# Patient Record
Sex: Female | Born: 1960 | Race: Black or African American | Hispanic: No | Marital: Single | Smoking: Current every day smoker
Health system: Southern US, Community
[De-identification: ages and names within clinical notes are randomized; demographics above are authoritative.]

## PROBLEM LIST (undated history)

## (undated) DIAGNOSIS — C801 Malignant (primary) neoplasm, unspecified: Secondary | ICD-10-CM

## (undated) DIAGNOSIS — B009 Herpesviral infection, unspecified: Secondary | ICD-10-CM

## (undated) DIAGNOSIS — B1920 Unspecified viral hepatitis C without hepatic coma: Secondary | ICD-10-CM

## (undated) DIAGNOSIS — I10 Essential (primary) hypertension: Secondary | ICD-10-CM

## (undated) DIAGNOSIS — N920 Excessive and frequent menstruation with regular cycle: Secondary | ICD-10-CM

## (undated) HISTORY — DX: Essential (primary) hypertension: I10

## (undated) HISTORY — PX: BREAST SURGERY: SHX581

## (undated) HISTORY — DX: Excessive and frequent menstruation with regular cycle: N92.0

## (undated) HISTORY — PX: NO PAST SURGERIES: SHX2092

---

## 2012-10-11 ENCOUNTER — Encounter (INDEPENDENT_AMBULATORY_CARE_PROVIDER_SITE_OTHER): Payer: Self-pay | Admitting: Surgery

## 2012-10-11 ENCOUNTER — Ambulatory Visit (INDEPENDENT_AMBULATORY_CARE_PROVIDER_SITE_OTHER): Payer: Medicaid Other | Admitting: Surgery

## 2012-10-11 VITALS — BP 142/88 | HR 60 | Temp 98.3°F | Resp 18 | Ht 59.0 in | Wt 101.2 lb

## 2012-10-11 DIAGNOSIS — D249 Benign neoplasm of unspecified breast: Secondary | ICD-10-CM

## 2012-10-11 NOTE — Progress Notes (Signed)
Patient ID: Megan Kelly. Megan Kelly, female   DOB: 11-Feb-1961, 51 y.o.   MRN: [id number]  Chief Complaint  Patient presents with  . New Evaluation    rt Br.    HPI [place]. [name] is a 51 y.o. female.   HPI She is referred by Dr. Tilda Burrow for evaluation of a right breast papilloma. She was found to have an abnormal mammogram on recent screening. A small lesion was identified the right breast. Ultrasound-guided biopsy revealed a papilloma. She has no previous history of problems with her breasts. She denies nipple discharge. Her family history is negative for breast cancer.  History reviewed. No pertinent past medical history.  History reviewed. No pertinent past surgical history.  Family History  Problem Relation Age of Onset  . Diabetes Mother   . Hypertension Mother   . Heart disease Father     Social History History  Substance Use Topics  . Smoking status: Current Every Day Smoker -- 0.5 packs/day  . Smokeless tobacco: Not on file   Comment: smoked for 15 yrs  . Alcohol Use: Not on file    No Known Allergies  No current outpatient prescriptions on file.    Review of Systems Review of Systems  Constitutional: Negative for fever, chills and unexpected weight change.  HENT: Negative for hearing loss, congestion, sore throat, trouble swallowing and voice change.   Eyes: Negative for visual disturbance.  Respiratory: Negative for cough and wheezing.   Cardiovascular: Negative for chest pain, palpitations and leg swelling.  Gastrointestinal: Negative for nausea, vomiting, abdominal pain, diarrhea, constipation, blood in stool, abdominal distention and anal bleeding.  Genitourinary: Negative for hematuria, vaginal bleeding and difficulty urinating.  Musculoskeletal: Negative for arthralgias.  Skin: Negative for rash and wound.  Neurological: Negative for seizures, syncope and headaches.  Hematological: Negative for adenopathy. Does not bruise/bleed easily.  Psychiatric/Behavioral:  Negative for confusion.    Blood pressure 142/88, pulse 60, temperature 98.3 F (36.8 C), resp. rate 18, height 4\' 11"  (1.499 m), weight 101 lb 3.2 oz (45.904 kg).  Physical Exam Physical Exam  Constitutional: She is oriented to person, place, and time. She appears well-developed and well-nourished. No distress.  HENT:  Head: Normocephalic and atraumatic.  Right Ear: External ear normal.  Left Ear: External ear normal.  Nose: Nose normal.  Mouth/Throat: Oropharynx is clear and moist. No oropharyngeal exudate.  Eyes: Conjunctivae normal are normal. Pupils are equal, round, and reactive to light. Right eye exhibits no discharge. Left eye exhibits no discharge. No scleral icterus.  Neck: Normal range of motion. Neck supple. No tracheal deviation present. No thyromegaly present.  Cardiovascular: Normal rate, regular rhythm, normal heart sounds and intact distal pulses.   No murmur heard. Pulmonary/Chest: Effort normal and breath sounds normal.  Abdominal: Soft. Bowel sounds are normal. She exhibits no distension. There is no tenderness. There is no rebound.  Musculoskeletal: Normal range of motion. She exhibits no edema and no tenderness.  Lymphadenopathy:    She has no cervical adenopathy.    She has no axillary adenopathy.  Neurological: She is alert and oriented to person, place, and time.  Skin: Skin is warm and dry. No rash noted. She is not diaphoretic. No erythema.  Psychiatric: Her behavior is normal. Judgment normal.  Breasts: There are no palpable masses in either breast. Areola and nipples are normal bilaterally  Data Reviewed I have reviewed her mammograms and biopsy report. 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 I discussed this with her in detail. I discussed the risks of surgery which includes but is not limited to bleeding, infection,  need for further surgery should malignancy be present, et Karie Soda. She understands and wishes to proceed. Surgery will be scheduled. Likelihood of success with good       Ahmari Garton A 10/11/2012, 4:14 PM

## 2012-10-12 ENCOUNTER — Encounter (INDEPENDENT_AMBULATORY_CARE_PROVIDER_SITE_OTHER): Payer: Self-pay

## 2012-10-25 ENCOUNTER — Encounter (HOSPITAL_COMMUNITY): Payer: Self-pay

## 2012-10-31 ENCOUNTER — Encounter (HOSPITAL_COMMUNITY): Payer: Self-pay

## 2012-10-31 ENCOUNTER — Encounter (HOSPITAL_COMMUNITY)
Admission: RE | Admit: 2012-10-31 | Discharge: 2012-10-31 | Disposition: A | Discharge status: Home / Self Care | Payer: Medicaid Other | Source: Ambulatory Visit | Attending: Surgery | Admitting: Surgery

## 2012-10-31 HISTORY — DX: Malignant (primary) neoplasm, unspecified: C80.1

## 2012-10-31 LAB — BASIC METABOLIC PANEL
BUN: 6 mg/dL (ref 6–23)
CO2: 26 mEq/L (ref 19–32)
Calcium: 9.4 mg/dL (ref 8.4–10.5)
Chloride: 105 mEq/L (ref 96–112)
Creatinine, Ser: 0.67 mg/dL (ref 0.50–1.10)

## 2012-10-31 LAB — CBC
HCT: 33.3 % — ABNORMAL LOW (ref 36.0–46.0)
MCH: 26.2 pg (ref 26.0–34.0)
MCV: 80.6 fL (ref 78.0–100.0)
RBC: 4.13 MIL/uL (ref 3.87–5.11)
WBC: 13 10*3/uL — ABNORMAL HIGH (ref 4.0–10.5)

## 2012-10-31 NOTE — Pre-Procedure Instructions (Signed)
[address] Alaynah M. [place]  10/31/2012   Your procedure is scheduled on:  11/08/12  Report to [facility] at 1100 AM.  Call this number if you have problems the morning of surgery: [phone]   Remember:   Do not eat food:After Midnight.     Take these medicines the morning of surgery with A SIP OF WATER: none   Do not wear jewelry, make-up or nail polish.  Do not wear lotions, powders, or perfumes. You may wear deodorant.  Do not shave 48 hours prior to surgery. Men may shave face and neck.  Do not bring valuables to the hospital.  Contacts, dentures or bridgework may not be worn into surgery.  Leave suitcase in the car. After surgery it may be brought to your room.  For patients admitted to the hospital, checkout time is 11:00 AM the day of discharge.   Patients discharged the day of surgery will not be allowed to drive home.  Name and phone number of your driver: family  Special Instructions: Shower using CHG 2 nights before surgery and the night before surgery.  If you shower the day of surgery use CHG.  Use special wash - you have one bottle of CHG for all showers.  You should use approximately 1/3 of the bottle for each shower.   Please read over the following fact sheets that you were given: Pain Booklet, Coughing and Deep Breathing, MRSA Information and Surgical Site Infection Prevention

## 2012-11-07 MED ORDER — CEFAZOLIN SODIUM-DEXTROSE 2-3 GM-% IV SOLR
2.0000 g | INTRAVENOUS | Status: AC
  Administered 2012-11-08: 2 g via INTRAVENOUS
  Filled 2012-11-07: qty 50

## 2012-11-07 NOTE — H&P (Signed)
Patient ID: Megan Kelly. Megan Kelly, female DOB: Apr 12, 1961, 51 y.o. MRN: [id number]  Chief Complaint   Patient presents with   .  New Evaluation     rt Br.    HPI  [place]. Megan Kelly is a 51 y.o. female.  HPI  She is referred by Dr. Tilda Burrow for evaluation of a right breast papilloma. She was found to have an abnormal mammogram on recent screening. A small lesion was identified the right breast. Ultrasound-guided biopsy revealed a papilloma. She has no previous history of problems with her breasts. She denies nipple discharge. Her family history is negative for breast cancer.  History reviewed. No pertinent past medical history.  History reviewed. No pertinent past surgical history.  Family History   Problem  Relation  Age of Onset   .  Diabetes  Mother    .  Hypertension  Mother    .  Heart disease  Father     Social History  History   Substance Use Topics   .  Smoking status:  Current Every Day Smoker -- 0.5 packs/day   .  Smokeless tobacco:  Not on file     Comment: smoked for 15 yrs    .  Alcohol Use:  Not on file    No Known Allergies  No current outpatient prescriptions on file.    Review of Systems  Review of Systems  Constitutional: Negative for fever, chills and unexpected weight change.  HENT: Negative for hearing loss, congestion, sore throat, trouble swallowing and voice change.  Eyes: Negative for visual disturbance.  Respiratory: Negative for cough and wheezing.  Cardiovascular: Negative for chest pain, palpitations and leg swelling.  Gastrointestinal: Negative for nausea, vomiting, abdominal pain, diarrhea, constipation, blood in stool, abdominal distention and anal bleeding.  Genitourinary: Negative for hematuria, vaginal bleeding and difficulty urinating.  Musculoskeletal: Negative for arthralgias.  Skin: Negative for rash and wound.  Neurological: Negative for seizures, syncope and headaches.  Hematological: Negative for adenopathy. Does not bruise/bleed easily.    Psychiatric/Behavioral: Negative for confusion.   Blood pressure 142/88, pulse 60, temperature 98.3 F (36.8 C), resp. rate 18, height 4\' 11"  (1.499 m), weight 101 lb 3.2 oz (45.904 kg).  Physical Exam  Physical Exam  Constitutional: She is oriented to person, place, and time. She appears well-developed and well-nourished. No distress.  HENT:  Head: Normocephalic and atraumatic.  Right Ear: External ear normal.  Left Ear: External ear normal.  Nose: Nose normal.  Mouth/Throat: Oropharynx is clear and moist. No oropharyngeal exudate.  Eyes: Conjunctivae normal are normal. Pupils are equal, round, and reactive to light. Right eye exhibits no discharge. Left eye exhibits no discharge. No scleral icterus.  Neck: Normal range of motion. Neck supple. No tracheal deviation present. No thyromegaly present.  Cardiovascular: Normal rate, regular rhythm, normal heart sounds and intact distal pulses.  No murmur heard.  Pulmonary/Chest: Effort normal and breath sounds normal.  Abdominal: Soft. Bowel sounds are normal. She exhibits no distension. There is no tenderness. There is no rebound.  Musculoskeletal: Normal range of motion. She exhibits no edema and no tenderness.  Lymphadenopathy:  She has no cervical adenopathy.  She has no axillary adenopathy.  Neurological: She is alert and oriented to person, place, and time.  Skin: Skin is warm and dry. No rash noted. She is not diaphoretic. No erythema.  Psychiatric: Her behavior is normal. Judgment normal.  Breasts: There are no palpable masses in either breast. Areola and nipples are normal bilaterally  Data Reviewed  I have reviewed her mammograms and biopsy report. 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 I discussed this with her in detail. I discussed the risks of surgery which includes but is not limited to  bleeding, infection, need for further surgery should malignancy be present, et Karie Soda. She understands and wishes to proceed. Surgery will be scheduled. Likelihood of success with good

## 2012-11-08 ENCOUNTER — Encounter (HOSPITAL_COMMUNITY): Payer: Self-pay | Admitting: Certified Registered"

## 2012-11-08 ENCOUNTER — Ambulatory Visit (HOSPITAL_COMMUNITY)
Admission: RE | Admit: 2012-11-08 | Discharge: 2012-11-08 | Disposition: A | Discharge status: Home / Self Care | Payer: Medicaid Other | Source: Ambulatory Visit | Attending: Surgery | Admitting: Surgery

## 2012-11-08 ENCOUNTER — Encounter (HOSPITAL_COMMUNITY): Payer: Self-pay | Admitting: *Deleted

## 2012-11-08 ENCOUNTER — Encounter (HOSPITAL_COMMUNITY)
Admission: RE | Disposition: A | Discharge status: Home / Self Care | Payer: Self-pay | Source: Ambulatory Visit | Attending: Surgery

## 2012-11-08 ENCOUNTER — Ambulatory Visit (HOSPITAL_COMMUNITY): Payer: Medicaid Other | Admitting: Certified Registered"

## 2012-11-08 DIAGNOSIS — F17200 Nicotine dependence, unspecified, uncomplicated: Secondary | ICD-10-CM | POA: Insufficient documentation

## 2012-11-08 DIAGNOSIS — Z01812 Encounter for preprocedural laboratory examination: Secondary | ICD-10-CM | POA: Insufficient documentation

## 2012-11-08 DIAGNOSIS — D249 Benign neoplasm of unspecified breast: Secondary | ICD-10-CM | POA: Insufficient documentation

## 2012-11-08 DIAGNOSIS — B1920 Unspecified viral hepatitis C without hepatic coma: Secondary | ICD-10-CM | POA: Insufficient documentation

## 2012-11-08 DIAGNOSIS — N6019 Diffuse cystic mastopathy of unspecified breast: Secondary | ICD-10-CM

## 2012-11-08 DIAGNOSIS — N6089 Other benign mammary dysplasias of unspecified breast: Secondary | ICD-10-CM

## 2012-11-08 HISTORY — PX: BREAST LUMPECTOMY WITH NEEDLE LOCALIZATION: SHX5759

## 2012-11-08 SURGERY — BREAST LUMPECTOMY WITH NEEDLE LOCALIZATION
Anesthesia: General | Site: Breast | Laterality: Right | Wound class: Clean

## 2012-11-08 MED ORDER — OXYCODONE HCL 5 MG PO TABS
ORAL_TABLET | ORAL | Status: AC
Start: 2012-11-08 — End: 2012-11-08
  Administered 2012-11-08: 5 mg via ORAL
  Filled 2012-11-08: qty 1

## 2012-11-08 MED ORDER — LACTATED RINGERS IV SOLN
INTRAVENOUS | Status: DC | PRN
  Administered 2012-11-08: 13:00:00 via INTRAVENOUS

## 2012-11-08 MED ORDER — BUPIVACAINE HCL (PF) 0.25 % IJ SOLN
INTRAMUSCULAR | Status: AC
  Filled 2012-11-08: qty 30

## 2012-11-08 MED ORDER — HYDROCODONE-ACETAMINOPHEN 5-325 MG PO TABS: 1.0000 | ORAL_TABLET | ORAL | Status: DC | PRN

## 2012-11-08 MED ORDER — PROPOFOL 10 MG/ML IV BOLUS
INTRAVENOUS | Status: DC | PRN
  Administered 2012-11-08: 160 mg via INTRAVENOUS

## 2012-11-08 MED ORDER — BUPIVACAINE-EPINEPHRINE PF 0.25-1:200000 % IJ SOLN
INTRAMUSCULAR | Status: AC
  Filled 2012-11-08: qty 30

## 2012-11-08 MED ORDER — LIDOCAINE HCL (CARDIAC) 20 MG/ML IV SOLN
INTRAVENOUS | Status: DC | PRN
  Administered 2012-11-08: 80 mg via INTRAVENOUS

## 2012-11-08 MED ORDER — MIDAZOLAM HCL 5 MG/5ML IJ SOLN
INTRAMUSCULAR | Status: DC | PRN
  Administered 2012-11-08: 2 mg via INTRAVENOUS

## 2012-11-08 MED ORDER — 0.9 % SODIUM CHLORIDE (POUR BTL) OPTIME
TOPICAL | Status: DC | PRN
  Administered 2012-11-08: 1000 mL

## 2012-11-08 MED ORDER — OXYCODONE HCL 5 MG PO TABS
5.0000 mg | ORAL_TABLET | ORAL | Status: DC | PRN
  Administered 2012-11-08: 5 mg via ORAL

## 2012-11-08 MED ORDER — BUPIVACAINE-EPINEPHRINE 0.25% -1:200000 IJ SOLN
INTRAMUSCULAR | Status: DC | PRN
  Administered 2012-11-08: 20 mL

## 2012-11-08 MED ORDER — FENTANYL CITRATE 0.05 MG/ML IJ SOLN
INTRAMUSCULAR | Status: DC | PRN
  Administered 2012-11-08: 75 ug via INTRAVENOUS
  Administered 2012-11-08: 50 ug via INTRAVENOUS

## 2012-11-08 SURGICAL SUPPLY — 40 items
BENZOIN TINCTURE PRP APPL 2/3 (GAUZE/BANDAGES/DRESSINGS) ×2 IMPLANT
BLADE SURG 10 STRL SS (BLADE) ×2 IMPLANT
BLADE SURG 15 STRL LF DISP TIS (BLADE) ×1 IMPLANT
BLADE SURG 15 STRL SS (BLADE) ×1
CANISTER SUCTION 2500CC (MISCELLANEOUS) IMPLANT
CHLORAPREP W/TINT 26ML (MISCELLANEOUS) ×2 IMPLANT
CLOTH BEACON ORANGE TIMEOUT ST (SAFETY) ×2 IMPLANT
COVER SURGICAL LIGHT HANDLE (MISCELLANEOUS) ×2 IMPLANT
DEVICE DUBIN SPECIMEN MAMMOGRA (MISCELLANEOUS) ×2 IMPLANT
DRAPE CHEST BREAST 15X10 FENES (DRAPES) ×2 IMPLANT
DRSG TEGADERM 4X4.75 (GAUZE/BANDAGES/DRESSINGS) ×2 IMPLANT
ELECT CAUTERY BLADE 6.4 (BLADE) ×2 IMPLANT
ELECT REM PT RETURN 9FT ADLT (ELECTROSURGICAL) ×2
ELECTRODE REM PT RTRN 9FT ADLT (ELECTROSURGICAL) ×1 IMPLANT
GLOVE BIO SURGEON STRL SZ7.5 (GLOVE) ×4 IMPLANT
GLOVE BIOGEL PI IND STRL 7.5 (GLOVE) ×1 IMPLANT
GLOVE BIOGEL PI INDICATOR 7.5 (GLOVE) ×1
GLOVE SURG SIGNA 7.5 PF LTX (GLOVE) ×2 IMPLANT
GOWN PREVENTION PLUS XLARGE (GOWN DISPOSABLE) ×2 IMPLANT
GOWN STRL NON-REIN LRG LVL3 (GOWN DISPOSABLE) ×4 IMPLANT
KIT BASIN OR (CUSTOM PROCEDURE TRAY) ×2 IMPLANT
KIT MARKER MARGIN INK (KITS) IMPLANT
KIT ROOM TURNOVER OR (KITS) ×2 IMPLANT
NS IRRIG 1000ML POUR BTL (IV SOLUTION) ×2 IMPLANT
PACK SURGICAL SETUP 50X90 (CUSTOM PROCEDURE TRAY) ×2 IMPLANT
PAD ARMBOARD 7.5X6 YLW CONV (MISCELLANEOUS) ×2 IMPLANT
PENCIL BUTTON HOLSTER BLD 10FT (ELECTRODE) ×2 IMPLANT
SPONGE GAUZE 4X4 12PLY (GAUZE/BANDAGES/DRESSINGS) ×2 IMPLANT
SPONGE LAP 4X18 X RAY DECT (DISPOSABLE) ×2 IMPLANT
STRIP CLOSURE SKIN 1/2X4 (GAUZE/BANDAGES/DRESSINGS) ×2 IMPLANT
SUT MON AB 4-0 PC3 18 (SUTURE) ×2 IMPLANT
SUT SILK 2 0 SH (SUTURE) IMPLANT
SUT VIC AB 3-0 SH 27 (SUTURE) ×1
SUT VIC AB 3-0 SH 27XBRD (SUTURE) ×1 IMPLANT
SYR BULB 3OZ (MISCELLANEOUS) ×2 IMPLANT
SYR CONTROL 10ML LL (SYRINGE) ×2 IMPLANT
TOWEL OR 17X24 6PK STRL BLUE (TOWEL DISPOSABLE) ×2 IMPLANT
TOWEL OR 17X26 10 PK STRL BLUE (TOWEL DISPOSABLE) ×2 IMPLANT
TUBE CONNECTING 12X1/4 (SUCTIONS) IMPLANT
YANKAUER SUCT BULB TIP NO VENT (SUCTIONS) IMPLANT

## 2012-11-08 NOTE — Anesthesia Preprocedure Evaluation (Addendum)
Anesthesia Evaluation  Patient identified by MRN, date of birth, ID band Patient awake    Reviewed: Allergy & Precautions, H&P , NPO status , Patient's Chart, lab work & pertinent test results  Airway Mallampati: I TM Distance: >3 FB     Dental  (+) Dental Advidsory Given and Chipped   Pulmonary Current Smoker,          Cardiovascular Rhythm:regular Rate:Normal     Neuro/Psych    GI/Hepatic (+) Hepatitis -, C and Unspecified  Endo/Other    Renal/GU      Musculoskeletal   Abdominal   Peds  Hematology   Anesthesia Other Findings Upper Left tooth chipped/rotted  Reproductive/Obstetrics                        Anesthesia Physical Anesthesia Plan  ASA: II  Anesthesia Plan: General   Post-op Pain Management:    Induction: Intravenous  Airway Management Planned: LMA  Additional Equipment:   Intra-op Plan:   Post-operative Plan: Extubation in OR  Informed Consent: I have reviewed the patients History and Physical, chart, labs and discussed the procedure including the risks, benefits and alternatives for the proposed anesthesia with the patient or authorized representative who has indicated his/her understanding and acceptance.     Plan Discussed with: CRNA, Anesthesiologist and Surgeon  Anesthesia Plan Comments:         Anesthesia Quick Evaluation

## 2012-11-08 NOTE — Preoperative (Signed)
Beta Blockers   Reason not to administer Beta Blockers:Not Applicable 

## 2012-11-08 NOTE — Anesthesia Postprocedure Evaluation (Signed)
  Anesthesia Post-op Note  Patient: Megan Kelly. Ed[place]  Procedure(s) Performed: Procedure(s) (LRB) with comments: BREAST LUMPECTOMY WITH NEEDLE LOCALIZATION (Right)  Patient Location: PACU  Anesthesia Type:General  Level of Consciousness: awake, oriented and patient cooperative  Airway and Oxygen Therapy: Patient Spontanous Breathing  Post-op Pain: mild  Post-op Assessment: Post-op Vital signs reviewed, Patient's Cardiovascular Status Stable, Respiratory Function Stable, Patent Airway, No signs of Nausea or vomiting and Pain level controlled  Post-op Vital Signs: Reviewed and stable  Complications: No apparent anesthesia complications

## 2012-11-08 NOTE — Op Note (Signed)
BREAST LUMPECTOMY WITH NEEDLE LOCALIZATION  Procedure Note  Delailah M. Caponigro 11/08/2012   Pre-op Diagnosis: RIGHT BREAST PAPILLOMA     Post-op Diagnosis: same  Procedure(s): BREAST LUMPECTOMY WITH NEEDLE LOCALIZATION  Surgeon(s): Shelly Rubenstein, MD  Anesthesia: General  Staff:  Pauletta Browns, RN - Circulator Janeece Agee Pingue, CST - Scrub Person Isidoro Donning, RN - Relief Circulator  Estimated Blood Loss: Minimal               Specimens: sent to path          [facility] A   Date: 11/08/2012  Time: 2:06 PM

## 2012-11-08 NOTE — Anesthesia Procedure Notes (Signed)
Procedure Name: LMA Insertion Date/Time: 11/08/2012 1:41 PM Performed by: Rossie Muskrat L Pre-anesthesia Checklist: Patient identified, Timeout performed, Emergency Drugs available, Suction available and Patient being monitored Patient Re-evaluated:Patient Re-evaluated prior to inductionOxygen Delivery Method: Circle system utilized Preoxygenation: Pre-oxygenation with 100% oxygen Intubation Type: IV induction Ventilation: Mask ventilation without difficulty LMA: LMA inserted LMA Size: 4.0 Tube type: Oral Number of attempts: 1 Placement Confirmation: breath sounds checked- equal and bilateral and positive ETCO2 Tube secured with: Tape Dental Injury: Teeth and Oropharynx as per pre-operative assessment

## 2012-11-08 NOTE — Progress Notes (Signed)
Films from breast ctr with patient.

## 2012-11-08 NOTE — Interval H&P Note (Signed)
History and Physical Interval Note:  No change in H and P  11/08/2012 11:40 AM  Megan Kelly  has presented today for surgery, with the diagnosis of right breast papalloma  The various methods of treatment have been discussed with the patient and family. After consideration of risks, benefits and other options for treatment, the patient has consented to  Procedure(s) (LRB) with comments: BREAST LUMPECTOMY WITH NEEDLE LOCALIZATION (Right) as a surgical intervention .  The patient's history has been reviewed, patient examined, no change in status, stable for surgery.  I have reviewed the patient's chart and labs.  Questions were answered to the patient's satisfaction.     Joleen Stuckert A

## 2012-11-08 NOTE — Transfer of Care (Signed)
Immediate Anesthesia Transfer of Care Note  Patient: Megan Kelly. [name]  Procedure(s) Performed: Procedure(s) (LRB) with comments: BREAST LUMPECTOMY WITH NEEDLE LOCALIZATION (Right)  Patient Location: PACU  Anesthesia Type:General  Level of Consciousness: awake, alert  and oriented  Airway & Oxygen Therapy: Patient Spontanous Breathing and Patient connected to nasal cannula oxygen  Post-op Assessment: Report given to PACU RN, Post -op Vital signs reviewed and stable and Patient moving all extremities  Post vital signs: Reviewed and stable  Complications: No apparent anesthesia complications

## 2012-11-09 ENCOUNTER — Encounter (HOSPITAL_COMMUNITY): Payer: Self-pay | Admitting: Surgery

## 2012-11-09 NOTE — Op Note (Signed)
NAMEMarland Kitchen  Megan Kelly, Megan Kelly NO.:  [id number]  MEDICAL RECORD NO.:  [id number]  LOCATION:  [facility]                         FACILITY:  [facility]  PHYSICIAN:  Abigail Miyamoto, M.D. DATE OF BIRTH:  1961-05-23  DATE OF PROCEDURE:  11/08/2012 DATE OF DISCHARGE:  11/08/2012                              OPERATIVE REPORT   PREOPERATIVE DIAGNOSIS:  Right breast papilloma.  POSTOPERATIVE DIAGNOSIS:  Right breast papilloma.  PROCEDURE:  Needle localized right breast lumpectomy.  SURGEONS:  Abigail Miyamoto, MD  ANESTHESIA:  General and 0.5% Marcaine.  ESTIMATED BLOOD LOSS:  Minimal.  INDICATIONS:  This is a 51 year old female with papilloma of the right breast.  Decision was made to proceed with lumpectomy under needle localization.  PROCEDURE IN DETAIL:  The patient was brought to the operating room and identified as Megan Kelly.  She was placed supine on the operating room table, and general anesthesia was induced.  Her right breast was then prepped and draped in usual sterile fashion.  The skin around the localization wire was anesthetized with Marcaine.  I made a longitudinal incision on the lower inner quadrant of the breast, incorporating the wire with a scalpel.  I took this down to the breast tissue with electrocautery.  A wide lumpectomy was then performed under needle localization, removing all the tissue around localization wire going down to the chest wall.  Again, wide margins appeared to be achieved. The specimen was removed in its entirety and x-ray confirmed the suspicious area, was in the specimen.  It was then sent to Pathology for evaluation.  I anesthetized the wound further with Marcaine.  Hemostasis was achieved with cautery.  I closed subcutaneous tissue with interrupted 3-0 Vicryl sutures and closed the skin with a running 4-0 Monocryl.  Steri-Strips, gauze, and Tegaderm were then applied.  The patient tolerated the procedure well.  All of the counts  were correct at the end of procedure.  The patient was then extubated in the operating room and taken in a stable condition to the recovery room.     Abigail Miyamoto, M.D.     DB/MEDQ  D:  11/08/2012  T:  11/09/2012  Job:  [id number]

## 2012-11-29 ENCOUNTER — Ambulatory Visit (INDEPENDENT_AMBULATORY_CARE_PROVIDER_SITE_OTHER): Payer: Medicaid Other | Admitting: Surgery

## 2012-11-29 ENCOUNTER — Encounter (INDEPENDENT_AMBULATORY_CARE_PROVIDER_SITE_OTHER): Payer: Self-pay | Admitting: Surgery

## 2012-11-29 VITALS — BP 124/77 | HR 70 | Temp 97.4°F | Resp 14 | Ht <= 58 in | Wt 102.0 lb

## 2012-11-29 DIAGNOSIS — Z09 Encounter for follow-up examination after completed treatment for conditions other than malignant neoplasm: Secondary | ICD-10-CM

## 2012-11-29 NOTE — Progress Notes (Signed)
Subjective:     Patient ID: Megan Kelly. Megan Kelly, female   DOB: 06/09/1961, 51 y.o.   MRN: [id number]  HPI She is here for her first postop visit status post right breast lumpectomy. She is doing well and has no complaints.  Review of Systems     Objective:   Physical Exam The incision is healing well. There is no evidence of infection. The final pathology shows benign disease with no evidence of malignancy    Assessment:     Patient stable postop    Plan:     Continue self examinations and yearly mammograms. I will see her back as needed

## 2012-12-09 ENCOUNTER — Emergency Department (HOSPITAL_BASED_OUTPATIENT_CLINIC_OR_DEPARTMENT_OTHER)
Admission: EM | Admit: 2012-12-09 | Discharge: 2012-12-09 | Disposition: A | Discharge status: Home / Self Care | Payer: Medicaid Other | Attending: Emergency Medicine | Admitting: Emergency Medicine

## 2012-12-09 ENCOUNTER — Encounter (HOSPITAL_BASED_OUTPATIENT_CLINIC_OR_DEPARTMENT_OTHER): Payer: Self-pay | Admitting: Emergency Medicine

## 2012-12-09 ENCOUNTER — Emergency Department (HOSPITAL_BASED_OUTPATIENT_CLINIC_OR_DEPARTMENT_OTHER): Payer: Medicaid Other

## 2012-12-09 DIAGNOSIS — Z23 Encounter for immunization: Secondary | ICD-10-CM | POA: Insufficient documentation

## 2012-12-09 DIAGNOSIS — S6980XA Other specified injuries of unspecified wrist, hand and finger(s), initial encounter: Secondary | ICD-10-CM | POA: Insufficient documentation

## 2012-12-09 DIAGNOSIS — Y939 Activity, unspecified: Secondary | ICD-10-CM | POA: Insufficient documentation

## 2012-12-09 DIAGNOSIS — F17200 Nicotine dependence, unspecified, uncomplicated: Secondary | ICD-10-CM | POA: Insufficient documentation

## 2012-12-09 DIAGNOSIS — Y929 Unspecified place or not applicable: Secondary | ICD-10-CM | POA: Insufficient documentation

## 2012-12-09 DIAGNOSIS — X58XXXA Exposure to other specified factors, initial encounter: Secondary | ICD-10-CM | POA: Insufficient documentation

## 2012-12-09 DIAGNOSIS — IMO0002 Reserved for concepts with insufficient information to code with codable children: Secondary | ICD-10-CM

## 2012-12-09 DIAGNOSIS — Z8619 Personal history of other infectious and parasitic diseases: Secondary | ICD-10-CM | POA: Insufficient documentation

## 2012-12-09 DIAGNOSIS — Z859 Personal history of malignant neoplasm, unspecified: Secondary | ICD-10-CM | POA: Insufficient documentation

## 2012-12-09 DIAGNOSIS — S6990XA Unspecified injury of unspecified wrist, hand and finger(s), initial encounter: Secondary | ICD-10-CM | POA: Insufficient documentation

## 2012-12-09 HISTORY — DX: Herpesviral infection, unspecified: B00.9

## 2012-12-09 MED ORDER — TETANUS-DIPHTH-ACELL PERTUSSIS 5-2.5-18.5 LF-MCG/0.5 IM SUSP
0.5000 mL | Freq: Once | INTRAMUSCULAR | Status: AC
  Administered 2012-12-09: 0.5 mL via INTRAMUSCULAR
  Filled 2012-12-09: qty 0.5

## 2012-12-09 MED ORDER — TRAMADOL HCL 50 MG PO TABS: 50.0000 mg | ORAL_TABLET | Freq: Four times a day (QID) | ORAL | Status: DC | PRN

## 2012-12-09 NOTE — ED Notes (Signed)
Pt with large callous looking formation to left middle finger, pt feels like their is something inside of the finger

## 2012-12-09 NOTE — ED Provider Notes (Signed)
History     CSN: [id number]  Arrival date & time 12/09/12  0129   First MD Initiated Contact with Patient 12/09/12 0207      Chief Complaint  Patient presents with  . Finger Injury    (Consider location/radiation/quality/duration/timing/severity/associated sxs/prior treatment) Patient is a 51 y.o. female presenting with hand pain. The history is provided by the patient.  Hand Pain This is a chronic problem. The current episode started more than 1 week ago (noted a callous on the distal palmar aspect of the left middle finger and a feeling that something was in the finger on November 08, 2012 or before.  Does not recall trauma). The problem occurs constantly. The problem has not changed since onset.Pertinent negatives include no chest pain, no abdominal pain, no headaches and no shortness of breath. Nothing aggravates the symptoms. Nothing relieves the symptoms. She has tried nothing for the symptoms. The treatment provided no relief.    Past Medical History  Diagnosis Date  . Cancer   . Herpes simplex type 2 infection     Past Surgical History  Procedure Date  . No past surgeries   . Breast lumpectomy with needle localization 11/08/2012    Procedure: BREAST LUMPECTOMY WITH NEEDLE LOCALIZATION;  Surgeon: Shelly Rubenstein, MD;  Location: [facility] OR;  Service: General;  Laterality: Right;  . Breast surgery     Family History  Problem Relation Age of Onset  . Diabetes Mother   . Hypertension Mother   . Heart disease Father     History  Substance Use Topics  . Smoking status: Current Every Day Smoker -- 0.5 packs/day for 15 years    Types: Cigarettes  . Smokeless tobacco: Not on file     Comment: smoked for 15 yrs  . Alcohol Use: No     Comment: quit 2 yrs ago    OB History    Grav Para Term Preterm Abortions TAB SAB Ect Mult Living                  Review of Systems  Respiratory: Negative for shortness of breath.   Cardiovascular: Negative for chest pain.   Gastrointestinal: Negative for abdominal pain.  Neurological: Negative for headaches.  All other systems reviewed and are negative.    Allergies  Review of patient's allergies indicates no known allergies.  Home Medications   Current Outpatient Rx  Name  Route  Sig  Dispense  Refill  . HYDROCODONE-ACETAMINOPHEN 5-325 MG PO TABS   Oral   Take 1-2 tablets by mouth every 4 (four) hours as needed for pain.   30 tablet   1     BP 130/88  Pulse 72  Temp 98.9 F (37.2 C) (Oral)  Resp 16  Ht 4\' 11"  (1.499 m)  Wt 106 lb (48.081 kg)  BMI 21.41 kg/m2  SpO2 100%  LMP 11/30/2012  Physical Exam  Constitutional: She is oriented to person, place, and time. She appears well-developed and well-nourished. No distress.  HENT:  Head: Normocephalic and atraumatic.  Mouth/Throat: Oropharynx is clear and moist.  Eyes: Conjunctivae normal are normal. Pupils are equal, round, and reactive to light.  Neck: Normal range of motion. Neck supple.  Cardiovascular: Normal rate, regular rhythm and intact distal pulses.   Pulmonary/Chest: Effort normal and breath sounds normal.  Abdominal: Soft. Bowel sounds are normal.  Musculoskeletal: Normal range of motion. She exhibits no tenderness.       Large callous on the palmar aspect of the  distal left miffle finger.  No opening no erythema no swelling no fluctuance. Cap refill < 2 sec to all fingers of the left hand.  Left hand neurovascularly intact  Neurological: She is alert and oriented to person, place, and time.  Skin: Skin is warm and dry. No rash noted. No erythema.  Psychiatric: She has a normal mood and affect.    ED Course  Procedures (including critical care time)  Labs Reviewed - No data to display Dg Hand Complete Left  12/09/2012  *RADIOLOGY REPORT*  Clinical Data: Question foreign body.  LEFT HAND - COMPLETE 3+ VIEW  Comparison: None.  Findings: There is a curvilinear radiopaque density projecting along the radial volar soft tissues  of the distal phalanx third digit.  No acute fracture or dislocation.  Associated soft tissue swelling.  No aggressive osseous lesion.  IMPRESSION: 5 mm thin curvilinear radiopaque density within the radial volar soft tissues of the distal third digit.   Original Report Authenticated By: Jearld Lesch, M.D.      No diagnosis found.    MDM  Based on XRAY deep foreign body.  No signs of infection.  Suspect the foreign body has been there longer than 1 month. Due to chronicity will not extract in the ED.  Will provide pain meds and patient to follow up with hand surgery.  Patient verbalizes understanding and agrees to follow up       Shawntez Dickison Smitty Cords, MD 12/09/12 [phone]

## 2013-03-12 ENCOUNTER — Encounter (INDEPENDENT_AMBULATORY_CARE_PROVIDER_SITE_OTHER): Payer: Self-pay

## 2013-09-12 ENCOUNTER — Emergency Department (HOSPITAL_BASED_OUTPATIENT_CLINIC_OR_DEPARTMENT_OTHER)
Admission: EM | Admit: 2013-09-12 | Discharge: 2013-09-12 | Disposition: A | Discharge status: Home / Self Care | Payer: Medicaid Other | Attending: Emergency Medicine | Admitting: Emergency Medicine

## 2013-09-12 ENCOUNTER — Encounter (HOSPITAL_BASED_OUTPATIENT_CLINIC_OR_DEPARTMENT_OTHER): Payer: Self-pay | Admitting: Emergency Medicine

## 2013-09-12 DIAGNOSIS — K5289 Other specified noninfective gastroenteritis and colitis: Secondary | ICD-10-CM | POA: Insufficient documentation

## 2013-09-12 DIAGNOSIS — F17200 Nicotine dependence, unspecified, uncomplicated: Secondary | ICD-10-CM | POA: Insufficient documentation

## 2013-09-12 DIAGNOSIS — Z8619 Personal history of other infectious and parasitic diseases: Secondary | ICD-10-CM | POA: Insufficient documentation

## 2013-09-12 DIAGNOSIS — K529 Noninfective gastroenteritis and colitis, unspecified: Secondary | ICD-10-CM

## 2013-09-12 DIAGNOSIS — R6883 Chills (without fever): Secondary | ICD-10-CM | POA: Insufficient documentation

## 2013-09-12 DIAGNOSIS — Z859 Personal history of malignant neoplasm, unspecified: Secondary | ICD-10-CM | POA: Insufficient documentation

## 2013-09-12 DIAGNOSIS — M549 Dorsalgia, unspecified: Secondary | ICD-10-CM | POA: Insufficient documentation

## 2013-09-12 HISTORY — DX: Unspecified viral hepatitis C without hepatic coma: B19.20

## 2013-09-12 LAB — CBC WITH DIFFERENTIAL/PLATELET
Basophils Relative: 0 % (ref 0–1)
Eosinophils Absolute: 0.1 10*3/uL (ref 0.0–0.7)
Eosinophils Relative: 1 % (ref 0–5)
HCT: 29.4 % — ABNORMAL LOW (ref 36.0–46.0)
Hemoglobin: 9.4 g/dL — ABNORMAL LOW (ref 12.0–15.0)
Lymphs Abs: 3.3 10*3/uL (ref 0.7–4.0)
MCH: 23.7 pg — ABNORMAL LOW (ref 26.0–34.0)
MCHC: 32 g/dL (ref 30.0–36.0)
MCV: 74.1 fL — ABNORMAL LOW (ref 78.0–100.0)
Monocytes Absolute: 1.6 10*3/uL — ABNORMAL HIGH (ref 0.1–1.0)
Monocytes Relative: 9 % (ref 3–12)
Neutrophils Relative %: 73 % (ref 43–77)

## 2013-09-12 LAB — COMPREHENSIVE METABOLIC PANEL
Albumin: 4.1 g/dL (ref 3.5–5.2)
BUN: 6 mg/dL (ref 6–23)
Calcium: 9.7 mg/dL (ref 8.4–10.5)
Creatinine, Ser: 0.6 mg/dL (ref 0.50–1.10)
GFR calc Af Amer: >90 mL/min (ref >90)
Glucose, Bld: 146 mg/dL — ABNORMAL HIGH (ref 70–99)
Total Protein: 8.1 g/dL (ref 6.0–8.3)

## 2013-09-12 LAB — URINALYSIS, ROUTINE W REFLEX MICROSCOPIC
Bilirubin Urine: NEGATIVE
Leukocytes, UA: NEGATIVE
Nitrite: NEGATIVE
Specific Gravity, Urine: 1.009 (ref 1.005–1.030)
Urobilinogen, UA: 0.2 mg/dL (ref 0.0–1.0)
pH: 7 (ref 5.0–8.0)

## 2013-09-12 LAB — LIPASE, BLOOD: Lipase: 30 U/L (ref 11–59)

## 2013-09-12 MED ORDER — SODIUM CHLORIDE 0.9 % IV SOLN
Freq: Once | INTRAVENOUS | Status: AC
  Administered 2013-09-12: 21:00:00 via INTRAVENOUS

## 2013-09-12 MED ORDER — ONDANSETRON 8 MG PO TBDP: ORAL_TABLET | ORAL | Status: DC

## 2013-09-12 MED ORDER — ONDANSETRON HCL 4 MG/2ML IJ SOLN
4.0000 mg | Freq: Once | INTRAMUSCULAR | Status: AC
  Administered 2013-09-12: 4 mg via INTRAVENOUS
  Filled 2013-09-12: qty 2

## 2013-09-12 MED ORDER — KETOROLAC TROMETHAMINE 30 MG/ML IJ SOLN
30.0000 mg | Freq: Once | INTRAMUSCULAR | Status: AC
  Administered 2013-09-12: 30 mg via INTRAVENOUS
  Filled 2013-09-12: qty 1

## 2013-09-12 NOTE — ED Provider Notes (Signed)
CSN: [id number]     Arrival date & time 09/12/13  2052 History  This chart was scribed for Geoffery Lyons, MD by Clydene Laming, ED Scribe. This patient was seen in room MH03/MH03 and the patient's care was started at 9:20 PM.   Chief Complaint  Patient presents with  . Emesis  . Abdominal Pain  . Back Pain  . Chills    Patient is a 52 y.o. female presenting with back pain. The history is provided by the patient. No language interpreter was used.  Back Pain Associated symptoms: no headaches and no weakness     HPI Comments: Megan Kelly is a 52 y.o. female who presents to the Emergency Department complaining of emesis with associated back pain, chills, and abdominal pain onset one hour ago after eating a chicken sandwich at work. She denies a hx of gallbladder operations. Pt denies dysuria and diarrhea. Pt has hx of Herpes 2, Hepatitis C, and cancer. Pt states she is allergic to many different foods. Past Medical History  Diagnosis Date  . Cancer   . Herpes simplex type 2 infection   . Hepatitis C    Past Surgical History  Procedure Laterality Date  . No past surgeries    . Breast lumpectomy with needle localization  11/08/2012    Procedure: BREAST LUMPECTOMY WITH NEEDLE LOCALIZATION;  Surgeon: Shelly Rubenstein, MD;  Location: [facility];  Service: General;  Laterality: Right;  . Breast surgery     Family History  Problem Relation Age of Onset  . Diabetes Mother   . Hypertension Mother   . Heart disease Father    History  Substance Use Topics  . Smoking status: Current Every Day Smoker -- 0.50 packs/day for 15 years    Types: Cigarettes  . Smokeless tobacco: Not on file     Comment: smoked for 15 yrs  . Alcohol Use: No     Comment: quit 2 yrs ago   OB History   Grav Para Term Preterm Abortions TAB SAB Ect Mult Living                 Review of Systems  Constitutional: Positive for chills.  Gastrointestinal: Positive for vomiting.  Musculoskeletal: Positive for back  pain.  Allergic/Immunologic: Positive for food allergies.  Neurological: Negative for weakness and headaches.  All other systems reviewed and are negative.    Allergies  Review of patient's allergies indicates no known allergies.  Home Medications   Current Outpatient Rx  Name  Route  Sig  Dispense  Refill  . HYDROcodone-acetaminophen (NORCO) 5-325 MG per tablet   Oral   Take 1-2 tablets by mouth every 4 (four) hours as needed for pain.   30 tablet   1   . traMADol (ULTRAM) 50 MG tablet   Oral   Take 1 tablet (50 mg total) by mouth every 6 (six) hours as needed for pain.   15 tablet   0    Triage Vitals: BP 205/101  Pulse 58  Temp(Src) 96 F (35.6 C) (Rectal)  Resp 26  Ht 4\' 11"  (1.499 m)  Wt 100 lb (45.36 kg)  BMI 20.19 kg/m2  SpO2 100%  LMP 09/05/2013 Physical Exam  Nursing note and vitals reviewed. Constitutional: She is oriented to person, place, and time. She appears well-developed and well-nourished. No distress.  HENT:  Head: Normocephalic and atraumatic.  Eyes: Conjunctivae and EOM are normal.  Neck: Normal range of motion. Neck supple. No tracheal deviation present.  Cardiovascular: Normal rate, regular rhythm and normal heart sounds.   No murmur heard. Pulmonary/Chest: Effort normal and breath sounds normal. No respiratory distress. She has no wheezes. She has no rales.  Abdominal: Soft. Bowel sounds are normal. There is tenderness (Mild tenderness to palpation in the periumbilical region). There is no rebound and no guarding.  Musculoskeletal: Normal range of motion. She exhibits no edema.  Neurological: She is alert and oriented to person, place, and time. No cranial nerve deficit.  Skin: Skin is warm and dry.  Psychiatric: She has a normal mood and affect. Her behavior is normal.    ED Course  Procedures (including critical care time)  DIAGNOSTIC STUDIES: Oxygen Saturation is 100% on RA, normal by my interpretation.    COORDINATION OF  CARE: 9:25 PM- Discussed treatment plan with pt at bedside. Pt verbalized understanding and agreement with plan.   Labs Review Labs Reviewed - No data to display Imaging Review No results found.  MDM  No diagnosis found. Patient presents here with complaints of severe nausea and vomiting that started after eating a chicken sandwich this evening. She denies any fevers, chills, or abdominal pain. She denies any urinary complaints. Exam reveals mild tenderness to palpation in the periumbilical region without rebound or guarding. There is no right lower quadrant tenderness there is no point tenderness anywhere else. Workup reveals white count of 18,000, the significance of this I am uncertain. She is only had symptoms for approximately one hour and I suspect this is likely either a food born illness or acute viral gastroenteritis. She was given IV fluids, medications for pain and nausea and appears to be feeling better. She has had no further vomiting in the ER. She was reexamined and the abdomen remains benign. I feel as though she is appropriate for discharge, and to return as needed if her symptoms worsen or she develop high fever or bloody stool.    I personally performed the services described in this documentation, which was scribed in my presence. The recorded information has been reviewed and is accurate.     Geoffery Lyons, MD 09/12/13 2306

## 2013-09-12 NOTE — ED Notes (Signed)
Vomiting, back pain, chills, abd pain that started suddenly an hour ago.

## 2013-09-12 NOTE — ED Notes (Signed)
MD at bedside. 

## 2013-12-28 ENCOUNTER — Encounter (HOSPITAL_BASED_OUTPATIENT_CLINIC_OR_DEPARTMENT_OTHER): Payer: Self-pay | Admitting: Emergency Medicine

## 2013-12-28 ENCOUNTER — Emergency Department (HOSPITAL_BASED_OUTPATIENT_CLINIC_OR_DEPARTMENT_OTHER)
Admission: EM | Admit: 2013-12-28 | Discharge: 2013-12-28 | Disposition: A | Discharge status: Home / Self Care | Payer: Medicaid Other | Attending: Emergency Medicine | Admitting: Emergency Medicine

## 2013-12-28 ENCOUNTER — Emergency Department (HOSPITAL_BASED_OUTPATIENT_CLINIC_OR_DEPARTMENT_OTHER): Payer: Medicaid Other

## 2013-12-28 DIAGNOSIS — Z859 Personal history of malignant neoplasm, unspecified: Secondary | ICD-10-CM | POA: Insufficient documentation

## 2013-12-28 DIAGNOSIS — S0181XA Laceration without foreign body of other part of head, initial encounter: Secondary | ICD-10-CM

## 2013-12-28 DIAGNOSIS — W010XXA Fall on same level from slipping, tripping and stumbling without subsequent striking against object, initial encounter: Secondary | ICD-10-CM | POA: Insufficient documentation

## 2013-12-28 DIAGNOSIS — W1809XA Striking against other object with subsequent fall, initial encounter: Secondary | ICD-10-CM | POA: Insufficient documentation

## 2013-12-28 DIAGNOSIS — S0990XA Unspecified injury of head, initial encounter: Secondary | ICD-10-CM

## 2013-12-28 DIAGNOSIS — Y9301 Activity, walking, marching and hiking: Secondary | ICD-10-CM | POA: Insufficient documentation

## 2013-12-28 DIAGNOSIS — Z8619 Personal history of other infectious and parasitic diseases: Secondary | ICD-10-CM | POA: Insufficient documentation

## 2013-12-28 DIAGNOSIS — Z79899 Other long term (current) drug therapy: Secondary | ICD-10-CM | POA: Insufficient documentation

## 2013-12-28 DIAGNOSIS — S0120XA Unspecified open wound of nose, initial encounter: Secondary | ICD-10-CM | POA: Insufficient documentation

## 2013-12-28 DIAGNOSIS — S0510XA Contusion of eyeball and orbital tissues, unspecified eye, initial encounter: Secondary | ICD-10-CM | POA: Insufficient documentation

## 2013-12-28 DIAGNOSIS — Y929 Unspecified place or not applicable: Secondary | ICD-10-CM | POA: Insufficient documentation

## 2013-12-28 DIAGNOSIS — F17200 Nicotine dependence, unspecified, uncomplicated: Secondary | ICD-10-CM | POA: Insufficient documentation

## 2013-12-28 DIAGNOSIS — W19XXXA Unspecified fall, initial encounter: Secondary | ICD-10-CM

## 2013-12-28 MED ORDER — OXYCODONE-ACETAMINOPHEN 5-325 MG PO TABS
2.0000 | ORAL_TABLET | Freq: Once | ORAL | Status: AC
  Administered 2013-12-28: 2 via ORAL
  Filled 2013-12-28: qty 2

## 2013-12-28 MED ORDER — CYCLOBENZAPRINE HCL 10 MG PO TABS: 10.0000 mg | ORAL_TABLET | Freq: Two times a day (BID) | ORAL | Status: DC | PRN

## 2013-12-28 MED ORDER — OXYCODONE-ACETAMINOPHEN 5-325 MG PO TABS: 2.0000 | ORAL_TABLET | ORAL | Status: DC | PRN

## 2013-12-28 NOTE — ED Provider Notes (Signed)
CSN: [id number]     Arrival date & time 12/28/13  1316 History   First MD Initiated Contact with Patient 12/28/13 1325     Chief Complaint  Patient presents with  . Head Injury   (Consider location/radiation/quality/duration/timing/severity/associated sxs/prior Treatment) HPI Comments: Patient is a 53 year old female who presents after a fall that occurred prior to arrival. Patient reports walking up concrete steps when she tripped and her face hit the concrete step. Since the fall, patient reports swelling to her face and severe headache. The headache is throbbing without radiation. She denies LOC. Patient also reports laceration to her nose and nose pain. Palpation of the nose makes the pain worse. No alleviating factors. Patient denies any other injury or associated symptoms.   Patient is a 53 y.o. female presenting with head injury. The history is provided by the patient. No language interpreter was used.  Head Injury Location:  Generalized Time since incident:  1 hour Mechanism of injury: fall   Pain details:    Quality:  Throbbing   Severity:  Severe   Duration:  1 hour   Timing:  Constant   Progression:  Unchanged Chronicity:  New Relieved by:  Nothing Worsened by:  Nothing tried Ineffective treatments:  None tried Associated symptoms: headache   Associated symptoms: no nausea, no neck pain and no vomiting     Past Medical History  Diagnosis Date  . Cancer   . Herpes simplex type 2 infection   . Hepatitis C    Past Surgical History  Procedure Laterality Date  . No past surgeries    . Breast lumpectomy with needle localization  11/08/2012    Procedure: BREAST LUMPECTOMY WITH NEEDLE LOCALIZATION;  Surgeon: Harl Bowie, MD;  Location: [facility];  Service: General;  Laterality: Right;  . Breast surgery     Family History  Problem Relation Age of Onset  . Diabetes Mother   . Hypertension Mother   . Heart disease Father    History  Substance Use Topics  . Smoking  status: Current Every Day Smoker -- 0.50 packs/day for 15 years    Types: Cigarettes  . Smokeless tobacco: Not on file     Comment: smoked for 15 yrs  . Alcohol Use: No     Comment: quit 2 yrs ago   OB History   Grav Para Term Preterm Abortions TAB SAB Ect Mult Living                 Review of Systems  Constitutional: Negative for fever, chills and fatigue.  HENT: Positive for facial swelling. Negative for trouble swallowing.   Eyes: Negative for visual disturbance.  Respiratory: Negative for shortness of breath.   Cardiovascular: Negative for chest pain and palpitations.  Gastrointestinal: Negative for nausea, vomiting, abdominal pain and diarrhea.  Genitourinary: Negative for dysuria and difficulty urinating.  Musculoskeletal: Negative for arthralgias and neck pain.  Skin: Positive for wound. Negative for color change.  Neurological: Positive for headaches. Negative for dizziness and weakness.  Psychiatric/Behavioral: Negative for dysphoric mood.    Allergies  Review of patient's allergies indicates no known allergies.  Home Medications   Current Outpatient Rx  Name  Route  Sig  Dispense  Refill  . ferrous fumarate (HEMOCYTE - 106 MG FE) 325 (106 FE) MG TABS tablet   Oral   Take 1 tablet by mouth.         Marland Kitchen lisinopril (PRINIVIL,ZESTRIL) 10 MG tablet   Oral  Take 10 mg by mouth daily.         Marland Kitchen HYDROcodone-acetaminophen (NORCO) 5-325 MG per tablet   Oral   Take 1-2 tablets by mouth every 4 (four) hours as needed for pain.   30 tablet   1   . ondansetron (ZOFRAN ODT) 8 MG disintegrating tablet      8mg  ODT q4 hours prn nausea   6 tablet   0   . traMADol (ULTRAM) 50 MG tablet   Oral   Take 1 tablet (50 mg total) by mouth every 6 (six) hours as needed for pain.   15 tablet   0    BP 140/90  Pulse 84  Temp(Src) 98.1 F (36.7 C) (Oral)  Resp 18  SpO2 100%  LMP 12/10/2013 Physical Exam  Nursing note and vitals reviewed. Constitutional: She is  oriented to person, place, and time. She appears well-developed and well-nourished. No distress.  HENT:  Head: Normocephalic and atraumatic.  Mouth/Throat: Oropharynx is clear and moist. No oropharyngeal exudate.  No trismus noted. Right periorbital edema, bruising and tenderness to palpation. Right forehead edema and tenderness to palpation. Nasal bridge swelling and tenderness to palpation without obvious deformity. Nasal bridge 2cm superficial laceration with no active bleeding.   Eyes: Conjunctivae and EOM are normal.  Neck: Normal range of motion. Neck supple.  Cardiovascular: Normal rate and regular rhythm.  Exam reveals no gallop and no friction rub.   No murmur heard. Pulmonary/Chest: Effort normal and breath sounds normal. She has no wheezes. She has no rales. She exhibits no tenderness.  Abdominal: Soft. She exhibits no distension. There is no tenderness. There is no rebound.  Musculoskeletal: Normal range of motion.  No midline spine tenderness to palpation.   Neurological: She is alert and oriented to person, place, and time. Coordination normal.  Speech is goal-oriented. Moves limbs without ataxia.   Skin: Skin is warm and dry.  2cm superficial laceration to nasal bridge. No active bleeding.   Psychiatric: She has a normal mood and affect. Her behavior is normal.    ED Course  Procedures (including critical care time)  LACERATION REPAIR Performed by: Alvina Chou Authorized by: Alvina Chou Consent: Verbal consent obtained. Risks and benefits: risks, benefits and alternatives were discussed Consent given by: patient Patient identity confirmed: provided demographic data Prepped and Draped in normal sterile fashion Wound explored  Laceration Location: nasal bridge  Laceration Length: 2 cm  No Foreign Bodies seen or palpated  Anesthetic total: 0 ml  Irrigation method: syringe Amount of cleaning: standard  Skin closure: dermabond  Number of sutures:  n/a  Technique: n/a  Patient tolerance: Patient tolerated the procedure well with no immediate complications.   Labs Review Labs Reviewed - No data to display Imaging Review Ct Head Wo Contrast  12/28/2013   CLINICAL DATA:  Status post fall with laceration across the forehead and bridge of the nose, right eyebrow.  EXAM: CT HEAD WITHOUT CONTRAST  CT MAXILLOFACIAL WITHOUT CONTRAST  TECHNIQUE: Multidetector CT imaging of the head and maxillofacial structures were performed using the standard protocol without intravenous contrast. Multiplanar CT image reconstructions of the maxillofacial structures were also generated.  COMPARISON:  Head CT January 24, 2009  FINDINGS: CT HEAD FINDINGS  There is no midline shift, hydrocephalus, or mass. No acute hemorrhage or acute transcortical infarct is identified. The bony calvarium is intact. There is mild mucoperiosteal thickening of the left ethmoid sinus. There is soft tissue swelling over the right frontal scalp and  right cheek.  CT MAXILLOFACIAL FINDINGS  There is no acute fracture or dislocation. There is mild chronic left to right nasal septal deviation. There is mucoperiosteal thickening of the left maxillary and left ethmoid sinus without air-fluid levels. The right ostiomeatal complexes patent. The left ostial meatal complex is opacified. The orbits are normal. There is mild soft tissue swelling in the right frontal scalp and right lateral cheek.  IMPRESSION: No focal acute intracranial abnormality identified.  No acute fracture or dislocation of maxillofacial bones. Soft tissue swelling of the right frontal scalp and right lateral cheek.  Mucoperiosteal thickening of the left maxillary and left ethmoid sinus without evidence of acute sinusitis.   Electronically Signed   By: Abelardo Diesel M.D.   On: 12/28/2013 14:23   Ct Maxillofacial Wo Cm  12/28/2013   CLINICAL DATA:  Status post fall with laceration across the forehead and bridge of the nose, right eyebrow.   EXAM: CT HEAD WITHOUT CONTRAST  CT MAXILLOFACIAL WITHOUT CONTRAST  TECHNIQUE: Multidetector CT imaging of the head and maxillofacial structures were performed using the standard protocol without intravenous contrast. Multiplanar CT image reconstructions of the maxillofacial structures were also generated.  COMPARISON:  Head CT January 24, 2009  FINDINGS: CT HEAD FINDINGS  There is no midline shift, hydrocephalus, or mass. No acute hemorrhage or acute transcortical infarct is identified. The bony calvarium is intact. There is mild mucoperiosteal thickening of the left ethmoid sinus. There is soft tissue swelling over the right frontal scalp and right cheek.  CT MAXILLOFACIAL FINDINGS  There is no acute fracture or dislocation. There is mild chronic left to right nasal septal deviation. There is mucoperiosteal thickening of the left maxillary and left ethmoid sinus without air-fluid levels. The right ostiomeatal complexes patent. The left ostial meatal complex is opacified. The orbits are normal. There is mild soft tissue swelling in the right frontal scalp and right lateral cheek.  IMPRESSION: No focal acute intracranial abnormality identified.  No acute fracture or dislocation of maxillofacial bones. Soft tissue swelling of the right frontal scalp and right lateral cheek.  Mucoperiosteal thickening of the left maxillary and left ethmoid sinus without evidence of acute sinusitis.   Electronically Signed   By: Abelardo Diesel M.D.   On: 12/28/2013 14:23    EKG Interpretation   None       MDM   1. Fall, initial encounter   2. Head injury, initial encounter   3. Laceration of face, initial encounter     2:52 PM Patient's imaging unremarkable for acute bone changes such as fracture. Patient has some hematomas noted on CT. Vitals stable and patient afebrile. Superficial laceration of nasal bridge repaired with dermabond. Patient given percocet for pain. Patient will be discharged with pain medication and  instructions to return with worsening or concerning symptoms.     Alvina Chou, PA-C 12/28/13 1502

## 2013-12-28 NOTE — Discharge Instructions (Signed)
Take Percocet as needed for pain. Take Flexeril as needed for muscle spasm. Return to the ED or follow up with your doctor as needed for worsening or concerning symptoms.

## 2013-12-28 NOTE — ED Notes (Signed)
Pt amb to triage with quick steady gait in nad. Pt reports trip and fall forward onto face vs. Concrete steps. Denies loc or any other c/o.

## 2014-01-05 NOTE — ED Provider Notes (Signed)
Medical screening examination/treatment/procedure(s) were performed by non-physician practitioner and as supervising physician I was immediately available for consultation/collaboration.  EKG Interpretation   None         Tanna Furry, MD 01/05/14 [phone]

## 2014-01-27 ENCOUNTER — Emergency Department (HOSPITAL_BASED_OUTPATIENT_CLINIC_OR_DEPARTMENT_OTHER)
Admission: EM | Admit: 2014-01-27 | Discharge: 2014-01-28 | Disposition: A | Discharge status: Home / Self Care | Payer: No Typology Code available for payment source | Attending: Emergency Medicine | Admitting: Emergency Medicine

## 2014-01-27 ENCOUNTER — Encounter (HOSPITAL_BASED_OUTPATIENT_CLINIC_OR_DEPARTMENT_OTHER): Payer: Self-pay | Admitting: Emergency Medicine

## 2014-01-27 ENCOUNTER — Emergency Department (HOSPITAL_BASED_OUTPATIENT_CLINIC_OR_DEPARTMENT_OTHER): Payer: No Typology Code available for payment source

## 2014-01-27 DIAGNOSIS — Z79899 Other long term (current) drug therapy: Secondary | ICD-10-CM | POA: Insufficient documentation

## 2014-01-27 DIAGNOSIS — D259 Leiomyoma of uterus, unspecified: Secondary | ICD-10-CM | POA: Insufficient documentation

## 2014-01-27 DIAGNOSIS — I498 Other specified cardiac arrhythmias: Secondary | ICD-10-CM | POA: Insufficient documentation

## 2014-01-27 DIAGNOSIS — F17200 Nicotine dependence, unspecified, uncomplicated: Secondary | ICD-10-CM | POA: Insufficient documentation

## 2014-01-27 DIAGNOSIS — R112 Nausea with vomiting, unspecified: Secondary | ICD-10-CM | POA: Insufficient documentation

## 2014-01-27 DIAGNOSIS — R630 Anorexia: Secondary | ICD-10-CM | POA: Insufficient documentation

## 2014-01-27 DIAGNOSIS — R6883 Chills (without fever): Secondary | ICD-10-CM | POA: Insufficient documentation

## 2014-01-27 DIAGNOSIS — R102 Pelvic and perineal pain: Secondary | ICD-10-CM

## 2014-01-27 DIAGNOSIS — Z8619 Personal history of other infectious and parasitic diseases: Secondary | ICD-10-CM | POA: Insufficient documentation

## 2014-01-27 HISTORY — DX: Herpesviral infection, unspecified: B00.9

## 2014-01-27 LAB — CBC WITH DIFFERENTIAL/PLATELET
Basophils Absolute: 0 10*3/uL (ref 0.0–0.1)
Basophils Relative: 0 % (ref 0–1)
EOS ABS: 0 10*3/uL (ref 0.0–0.7)
Eosinophils Relative: 0 % (ref 0–5)
HCT: 37.6 % (ref 36.0–46.0)
Hemoglobin: 12.3 g/dL (ref 12.0–15.0)
LYMPHS ABS: 1.7 10*3/uL (ref 0.7–4.0)
Lymphocytes Relative: 9 % — ABNORMAL LOW (ref 12–46)
MCH: 26.6 pg (ref 26.0–34.0)
MCHC: 32.7 g/dL (ref 30.0–36.0)
MCV: 81.2 fL (ref 78.0–100.0)
Monocytes Absolute: 1.2 10*3/uL — ABNORMAL HIGH (ref 0.1–1.0)
Monocytes Relative: 6 % (ref 3–12)
Neutro Abs: 16.5 10*3/uL — ABNORMAL HIGH (ref 1.7–7.7)
Neutrophils Relative %: 85 % — ABNORMAL HIGH (ref 43–77)
PLATELETS: 349 10*3/uL (ref 150–400)
RBC: 4.63 MIL/uL (ref 3.87–5.11)
RDW: 25.2 % — ABNORMAL HIGH (ref 11.5–15.5)
WBC: 19.4 10*3/uL — ABNORMAL HIGH (ref 4.0–10.5)

## 2014-01-27 LAB — WET PREP, GENITAL
TRICH WET PREP: NONE SEEN
YEAST WET PREP: NONE SEEN

## 2014-01-27 LAB — BASIC METABOLIC PANEL
BUN: 7 mg/dL (ref 6–23)
CALCIUM: 9.8 mg/dL (ref 8.4–10.5)
CO2: 26 meq/L (ref 19–32)
Chloride: 102 mEq/L (ref 96–112)
Creatinine, Ser: 0.7 mg/dL (ref 0.50–1.10)
GFR calc Af Amer: >90 mL/min (ref >90)
GFR calc non Af Amer: >90 mL/min (ref >90)
GLUCOSE: 96 mg/dL (ref 70–99)
Potassium: 3.9 mEq/L (ref 3.7–5.3)
Sodium: 142 mEq/L (ref 137–147)

## 2014-01-27 LAB — URINALYSIS, ROUTINE W REFLEX MICROSCOPIC
Bilirubin Urine: NEGATIVE
GLUCOSE, UA: NEGATIVE mg/dL
KETONES UR: NEGATIVE mg/dL
Leukocytes, UA: NEGATIVE
Nitrite: NEGATIVE
Protein, ur: NEGATIVE mg/dL
Specific Gravity, Urine: 1.015 (ref 1.005–1.030)
Urobilinogen, UA: 0.2 mg/dL (ref 0.0–1.0)
pH: 6.5 (ref 5.0–8.0)

## 2014-01-27 LAB — URINE MICROSCOPIC-ADD ON

## 2014-01-27 MED ORDER — IOHEXOL 300 MG/ML  SOLN
50.0000 mL | Freq: Once | INTRAMUSCULAR | Status: AC | PRN
Start: 2014-01-27 — End: 2014-01-27
  Administered 2014-01-27: 50 mL via ORAL

## 2014-01-27 MED ORDER — IOHEXOL 300 MG/ML  SOLN
100.0000 mL | Freq: Once | INTRAMUSCULAR | Status: AC | PRN
  Administered 2014-01-27: 100 mL via INTRAVENOUS

## 2014-01-27 NOTE — ED Notes (Signed)
Reports nausea/vomiting with this pain.  Is not sexually active.  Reports abnormal vaginal discharge.

## 2014-01-27 NOTE — ED Notes (Signed)
appx an hour ago began having pain in LRQ into her pubic area. Vomited x 1

## 2014-01-27 NOTE — ED Provider Notes (Signed)
CSN: [id number]     Arrival date & time 01/27/14  36 History   First MD Initiated Contact with Patient 01/27/14 1818     Chief Complaint  Patient presents with  . Abdominal Pain   (Consider location/radiation/quality/duration/timing/severity/associated sxs/prior Treatment) Patient is a 53 y.o. female presenting with pelvic pain. The history is provided by the patient.  Pelvic Pain This is a new problem. The current episode started today. The problem occurs constantly. The problem has been gradually worsening. Associated symptoms include abdominal pain, anorexia, chills, nausea and vomiting (x1). Pertinent negatives include no fever, headaches or myalgias. The symptoms are aggravated by standing and walking. She has tried NSAIDs for the symptoms.   Megan Kelly is a 53 y.o. female who presents to the ED with vaginal bleeding and pelvic pain. She has had problems off and on for over a year. She has been to [facility] Jan 16th and had ultrasound and and [facility] and [facility] told her she had fibroids and cyst on her ovary. She has a follow up appointment March 11th but the pain was so bad she could not wait. The pain is associated with menses. The pain is so bad it causes nausea and her blood pressure to go up. Nothing makes the pain better. Moving or walking makes the pain worse. Last took ibuprofen one month ago. Has not taken tylenol.   Past Medical History  Diagnosis Date  . Hepatitis C   . Herpes    Past Surgical History  Procedure Laterality Date  . Breast surgery     No family history on file. History  Substance Use Topics  . Smoking status: Current Every Day Smoker  . Smokeless tobacco: Not on file  . Alcohol Use: No   OB History   Grav Para Term Preterm Abortions TAB SAB Ect Mult Living                 Review of Systems  Constitutional: Positive for chills. Negative for fever.  HENT: Negative.   Gastrointestinal: Positive for nausea, vomiting  (x1), abdominal pain and anorexia.  Genitourinary: Positive for vaginal bleeding and pelvic pain. Negative for dysuria, frequency, flank pain and vaginal discharge.  Musculoskeletal: Negative for myalgias.  Neurological: Negative for dizziness, syncope and headaches.  Psychiatric/Behavioral: Negative for confusion. The patient is not nervous/anxious.     Allergies  Review of patient's allergies indicates no known allergies.  Home Medications   Current Outpatient Rx  Name  Route  Sig  Dispense  Refill  . ferrous fumarate (HEMOCYTE - 106 MG FE) 325 (106 FE) MG TABS tablet   Oral   Take 1 tablet by mouth.         Marland Kitchen lisinopril (PRINIVIL,ZESTRIL) 10 MG tablet   Oral   Take 10 mg by mouth daily.          BP 119/75  Pulse 57  Temp(Src) 98.3 F (36.8 C) (Oral)  Resp 18  Ht 4\' 10"  (1.473 m)  Wt 101 lb (45.813 kg)  BMI 21.11 kg/m2  SpO2 100% Physical Exam  Nursing note and vitals reviewed. Constitutional: She is oriented to person, place, and time. She appears well-developed and well-nourished.  HENT:  Head: Normocephalic and atraumatic.  Eyes: Conjunctivae and EOM are normal.  Neck: Neck supple.  Cardiovascular: Bradycardia present.   Pulmonary/Chest: Effort normal.  Abdominal: Soft. Bowel sounds are normal. There is tenderness in the right lower quadrant. There is no rebound, no  guarding and no CVA tenderness.  Genitourinary:  External genitalia without lesions, scant blood vaginal vault, positive CMT, right adnexal tenderness, uterus slightly enlarged.   Musculoskeletal: Normal range of motion.  Neurological: She is alert and oriented to person, place, and time. No cranial nerve deficit.  Skin: Skin is warm and dry.  Psychiatric: She has a normal mood and affect. Her behavior is normal.   Results for orders placed during the hospital encounter of 01/27/14 (from the past 24 hour(s))  WET PREP, GENITAL     Status: Abnormal   Collection Time    01/27/14  7:05 PM       Result Value Range   Yeast Wet Prep HPF POC NONE SEEN  NONE SEEN   Trich, Wet Prep NONE SEEN  NONE SEEN   Clue Cells Wet Prep HPF POC MODERATE (*) NONE SEEN   WBC, Wet Prep HPF POC MODERATE (*) NONE SEEN  CBC WITH DIFFERENTIAL     Status: Abnormal   Collection Time    01/27/14  7:50 PM      Result Value Range   WBC 19.4 (*) 4.0 - 10.5 K/uL   RBC 4.63  3.87 - 5.11 MIL/uL   Hemoglobin 12.3  12.0 - 15.0 g/dL   HCT 37.6  36.0 - 46.0 %   MCV 81.2  78.0 - 100.0 fL   MCH 26.6  26.0 - 34.0 pg   MCHC 32.7  30.0 - 36.0 g/dL   RDW 25.2 (*) 11.5 - 15.5 %   Platelets 349  150 - 400 K/uL   Neutrophils Relative % 85 (*) 43 - 77 %   Neutro Abs 16.5 (*) 1.7 - 7.7 K/uL   Lymphocytes Relative 9 (*) 12 - 46 %   Lymphs Abs 1.7  0.7 - 4.0 K/uL   Monocytes Relative 6  3 - 12 %   Monocytes Absolute 1.2 (*) 0.1 - 1.0 K/uL   Eosinophils Relative 0  0 - 5 %   Eosinophils Absolute 0.0  0.0 - 0.7 K/uL   Basophils Relative 0  0 - 1 %   Basophils Absolute 0.0  0.0 - 0.1 K/uL   RBC Morphology ELLIPTOCYTES    BASIC METABOLIC PANEL     Status: None   Collection Time    01/27/14  7:50 PM      Result Value Range   Sodium 142  137 - 147 mEq/L   Potassium 3.9  3.7 - 5.3 mEq/L   Chloride 102  96 - 112 mEq/L   CO2 26  19 - 32 mEq/L   Glucose, Bld 96  70 - 99 mg/dL   BUN 7  6 - 23 mg/dL   Creatinine, Ser 0.70  0.50 - 1.10 mg/dL   Calcium 9.8  8.4 - 10.5 mg/dL   GFR calc non Af Amer >90  >90 mL/min   GFR calc Af Amer >90  >90 mL/min  URINALYSIS, ROUTINE W REFLEX MICROSCOPIC     Status: Abnormal   Collection Time    01/27/14  7:55 PM      Result Value Range   Color, Urine YELLOW  YELLOW   APPearance CLEAR  CLEAR   Specific Gravity, Urine 1.015  1.005 - 1.030   pH 6.5  5.0 - 8.0   Glucose, UA NEGATIVE  NEGATIVE mg/dL   Hgb urine dipstick SMALL (*) NEGATIVE   Bilirubin Urine NEGATIVE  NEGATIVE   Ketones, ur NEGATIVE  NEGATIVE mg/dL   Protein, ur NEGATIVE  NEGATIVE mg/dL   Urobilinogen, UA 0.2  0.0 - 1.0  mg/dL   Nitrite NEGATIVE  NEGATIVE   Leukocytes, UA NEGATIVE  NEGATIVE  URINE MICROSCOPIC-ADD ON     Status: Abnormal   Collection Time    01/27/14  7:55 PM      Result Value Range   Squamous Epithelial / LPF RARE  RARE   WBC, UA 0-2  <3 WBC/hpf   RBC / HPF 0-2  <3 RBC/hpf   Bacteria, UA FEW (*) RARE   Urine-Other MUCOUS PRESENT      ED Course: Dr. Florina Ou in to examine the patient and discuss CT results and follow up.   Procedures  Ct Abdomen Pelvis W Contrast  01/27/2014   CLINICAL DATA:  Right lower quadrant pain and pelvic areas, vomiting. History of hepatitis-C.  EXAM: CT ABDOMEN AND PELVIS WITH CONTRAST  TECHNIQUE: Multidetector CT imaging of the abdomen and pelvis was performed using the standard protocol following bolus administration of intravenous contrast.  CONTRAST:  71mL OMNIPAQUE IOHEXOL 300 MG/ML SOLN, 163mL OMNIPAQUE IOHEXOL 300 MG/ML SOLN  COMPARISON:  None available for comparison at time of study interpretation.  FINDINGS: Included view of the lung bases are clear. Visualized heart and pericardium are unremarkable.  The liver, spleen, gallbladder, pancreas and adrenal glands are unremarkable.  The stomach, small and large bowel are normal in course and caliber without inflammatory changes. Normal retrocecal appendix. No intraperitoneal free fluid nor free air.  Kidneys are orthotopic, demonstrating symmetric enhancement without nephrolithiasis, hydronephrosis or renal masses. The unopacified ureters are normal in course and caliber. Delayed imaging through the kidneys demonstrates symmetric prompt excretion to the proximal urinary collecting system. Urinary bladder is partially distended and unremarkable.  Great vessels are normal in course and caliber. No lymphadenopathy by CT size criteria. 2.6 cm right uterine mass with mild peripheral enhancement may reflect a leiomyoma. Prominent pelvic veins. The soft tissues and included osseous structures are nonsuspicious.  IMPRESSION: No  acute intra-abdominal or pelvic process ; normal retrocecal appendix.  2.6 cm right uterine mass could reflect a leiomyoma, though would be better characterized on pelvic ultrasound as clinically indicated. In addition, prominent pelvic veins may be seen with pelvic congestion syndrome.   Electronically Signed   By: Elon Alas   On: 01/27/2014 23:44    MDM  53 y.o. female with pelvic pain and vaginal bleeding.  CT shows Uterine fibroids and normal appendix. Will treat patient's pain and have her call her GYN tomorrow for follow up. She is stable for discharge without any immediate complications. She has no nausea or vomiting at this time. I have reviewed this patient's vital signs, nurses notes, appropriate labs and imaging. Results discussed with the patient and plan of care. BP 144/93  Pulse 58  Temp(Src) 98.3 F (36.8 C) (Oral)  Resp 20  Ht 4\' 10"  (1.473 m)  Wt 101 lb (45.813 kg)  BMI 21.11 kg/m2  SpO2 100%    Medication List    TAKE these medications       HYDROcodone-acetaminophen 5-325 MG per tablet  Commonly known as:  NORCO  Take 1 tablet by mouth every 6 (six) hours as needed for moderate pain.      ASK your doctor about these medications       ferrous fumarate 325 (106 FE) MG Tabs tablet  Commonly known as:  HEMOCYTE - 106 mg FE  Take 1 tablet by mouth.     lisinopril 10 MG tablet  Commonly  known as:  PRINIVIL,ZESTRIL  Take 10 mg by mouth daily.            Mayer, NP 01/28/14 Elida, NP 01/28/14 0111

## 2014-01-28 ENCOUNTER — Encounter (HOSPITAL_BASED_OUTPATIENT_CLINIC_OR_DEPARTMENT_OTHER): Payer: Self-pay | Admitting: Emergency Medicine

## 2014-01-28 LAB — GC/CHLAMYDIA PROBE AMP
CT Probe RNA: NEGATIVE
GC PROBE AMP APTIMA: NEGATIVE

## 2014-01-28 MED ORDER — HYDROCODONE-ACETAMINOPHEN 5-325 MG PO TABS
ORAL_TABLET | ORAL | Status: AC
  Filled 2014-01-28: qty 1

## 2014-01-28 MED ORDER — HYDROCODONE-ACETAMINOPHEN 5-325 MG PO TABS: 1.0000 | ORAL_TABLET | Freq: Four times a day (QID) | ORAL | Status: DC | PRN

## 2014-01-28 MED ORDER — HYDROCODONE-ACETAMINOPHEN 5-325 MG PO TABS
1.0000 | ORAL_TABLET | Freq: Once | ORAL | Status: AC
Start: 2014-01-28 — End: 2014-01-28
  Administered 2014-01-28: 1 via ORAL

## 2014-01-28 NOTE — ED Provider Notes (Signed)
Medical screening examination/treatment/procedure(s) were conducted as a shared visit with non-physician practitioner(s) and myself.  I personally evaluated the patient during the encounter.  12:55 AM Patient states her pain is improved. It is located in the suprapubic region. Her abdomen is soft with mild to moderate suprapubic tenderness.    Wynetta Fines, MD 01/28/14 [phone]

## 2014-02-06 ENCOUNTER — Encounter: Payer: Self-pay | Admitting: *Deleted

## 2014-03-14 ENCOUNTER — Encounter: Payer: Self-pay | Admitting: Obstetrics & Gynecology

## 2014-03-14 ENCOUNTER — Ambulatory Visit (INDEPENDENT_AMBULATORY_CARE_PROVIDER_SITE_OTHER): Payer: Medicaid Other | Admitting: Obstetrics & Gynecology

## 2014-03-14 ENCOUNTER — Other Ambulatory Visit (HOSPITAL_COMMUNITY)
Admission: RE | Admit: 2014-03-14 | Discharge: 2014-03-14 | Disposition: A | Discharge status: Home / Self Care | Payer: Medicaid Other | Source: Ambulatory Visit | Attending: Obstetrics and Gynecology | Admitting: Obstetrics and Gynecology

## 2014-03-14 ENCOUNTER — Other Ambulatory Visit (HOSPITAL_COMMUNITY)
Admission: RE | Admit: 2014-03-14 | Discharge: 2014-03-14 | Disposition: A | Discharge status: Home / Self Care | Payer: Medicaid Other | Source: Ambulatory Visit | Attending: Obstetrics & Gynecology | Admitting: Obstetrics & Gynecology

## 2014-03-14 VITALS — BP 112/67 | HR 59 | Temp 98.1°F | Ht 59.0 in | Wt 103.6 lb

## 2014-03-14 DIAGNOSIS — N939 Abnormal uterine and vaginal bleeding, unspecified: Secondary | ICD-10-CM | POA: Insufficient documentation

## 2014-03-14 DIAGNOSIS — R87810 Cervical high risk human papillomavirus (HPV) DNA test positive: Secondary | ICD-10-CM | POA: Insufficient documentation

## 2014-03-14 DIAGNOSIS — Z124 Encounter for screening for malignant neoplasm of cervix: Secondary | ICD-10-CM | POA: Insufficient documentation

## 2014-03-14 DIAGNOSIS — Z1151 Encounter for screening for human papillomavirus (HPV): Secondary | ICD-10-CM | POA: Insufficient documentation

## 2014-03-14 DIAGNOSIS — Z853 Personal history of malignant neoplasm of breast: Secondary | ICD-10-CM

## 2014-03-14 DIAGNOSIS — N926 Irregular menstruation, unspecified: Secondary | ICD-10-CM

## 2014-03-14 LAB — POCT PREGNANCY, URINE: PREG TEST UR: NEGATIVE

## 2014-03-14 MED ORDER — MEGESTROL ACETATE 40 MG PO TABS: 40.0000 mg | ORAL_TABLET | Freq: Two times a day (BID) | ORAL | Status: DC

## 2014-03-14 NOTE — Patient Instructions (Signed)
Return to clinic for any scheduled appointments or for any gynecologic concerns as needed.   

## 2014-03-14 NOTE — Progress Notes (Signed)
   CLINIC ENCOUNTER NOTE  History:  53 y.o. G2P1011 here today for AUB; bleeding twice a month for last two years. Was evaluated at [facility], told she had fibroids and ovarian cysts on 01/04/14 scan. She takes iron pills due to anemia, last Hgb 12.3 on 01/27/14.  Reports feeling cold; no thyroid levels checked.  The following portions of the patient's history were reviewed and updated as appropriate: allergies, current medications, past family history, past medical history, past social history, past surgical history and problem list.  Normal pap smear two years ago.  Review of Systems:  Pertinent items are noted in HPI.  Objective:  Physical Exam BP 112/67  Pulse 59  Temp(Src) 98.1 F (36.7 C) (Oral)  Ht 4\' 11"  (1.499 m)  Wt 103 lb 9.6 oz (46.993 kg)  BMI 20.91 kg/m2  LMP 02/26/2014 Gen: NAD Abd: Soft, nontender and nondistended Pelvic: Normal appearing external genitalia; normal appearing vaginal mucosa and cervix, mild atrophy noted. Pap done.  Normal discharge.  Small uterus, no other palpable masses, no uterine or adnexal tenderness  ENDOMETRIAL BIOPSY     The indications for endometrial biopsy were reviewed.   Risks of the biopsy including cramping, bleeding, infection, uterine perforation, inadequate specimen and need for additional procedures  were discussed. The patient states she understands and agrees to undergo procedure today. Consent was signed. Time out was performed. Urine HCG was negative. During the pelvic exam, the cervix was prepped with Betadine. A single-toothed tenaculum was placed on the anterior lip of the cervix to stabilize it. The 3 mm pipelle was introduced into the endometrial cavity without difficulty to a depth of 6.5 cm, and a small amount of tissue was obtained and sent to pathology. The instruments were removed from the patient's vagina. Minimal bleeding from the cervix was noted. The patient tolerated the procedure well. Routine post-procedure instructions were  given to the patient.     Assessment & Plan:  Patient has abnormal uterine bleeding .  Endometrial biopsy and pap done.  Will order abnormal uterine bleeding evaluation labs.  If indicated later, patient will be referred for pelvic ultrasound to evaluate for any structural gynecologic abnormalities.  Bleeding precautions reviewed.  Will contact patient with these results and plans for further evaluation/management.  Megace prescribed as needed  Mammogram application given  Bleeding precautions reviewed   Verita Schneiders, MD, [place] Attending [place], [place]

## 2014-03-15 LAB — CBC
HEMATOCRIT: 38.6 % (ref 36.0–46.0)
HEMOGLOBIN: 12.4 g/dL (ref 12.0–15.0)
MCH: 28.2 pg (ref 26.0–34.0)
MCHC: 32.1 g/dL (ref 30.0–36.0)
MCV: 87.7 fL (ref 78.0–100.0)
Platelets: 342 10*3/uL (ref 150–400)
RBC: 4.4 MIL/uL (ref 3.87–5.11)
RDW: 19.5 % — ABNORMAL HIGH (ref 11.5–15.5)
WBC: 10 10*3/uL (ref 4.0–10.5)

## 2014-03-15 LAB — FOLLICLE STIMULATING HORMONE: FSH: 49.7 m[IU]/mL

## 2014-03-15 LAB — TSH: TSH: 0.782 u[IU]/mL (ref 0.350–4.500)

## 2014-03-19 ENCOUNTER — Encounter: Payer: Self-pay | Admitting: Obstetrics & Gynecology

## 2014-03-19 ENCOUNTER — Telehealth: Payer: Self-pay

## 2014-03-19 DIAGNOSIS — R87618 Other abnormal cytological findings on specimens from cervix uteri: Secondary | ICD-10-CM | POA: Insufficient documentation

## 2014-03-19 DIAGNOSIS — R8789 Other abnormal findings in specimens from female genital organs: Secondary | ICD-10-CM | POA: Insufficient documentation

## 2014-03-19 NOTE — Telephone Encounter (Signed)
Attempted to call pt. No answer. Left message stating we are calling with results, please call clinic, state in message wether or not it is OK to leave detailed information on voicemail.

## 2014-03-19 NOTE — Telephone Encounter (Signed)
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 Needs to be repeated in one year. Problem list updated. Please call to inform patient of results. ------

## 2014-03-19 NOTE — Telephone Encounter (Signed)
Patient called and left message stating she is returning our phone call. Called patient and informed her of results and recommendations. Patient verbalized understanding and had no further questions

## 2014-03-21 ENCOUNTER — Ambulatory Visit (HOSPITAL_COMMUNITY): Payer: Medicaid Other

## 2014-03-22 ENCOUNTER — Ambulatory Visit (HOSPITAL_COMMUNITY)
Admission: RE | Admit: 2014-03-22 | Discharge: 2014-03-22 | Disposition: A | Discharge status: Home / Self Care | Payer: Medicaid Other | Source: Ambulatory Visit | Attending: Obstetrics & Gynecology | Admitting: Obstetrics & Gynecology

## 2014-03-22 DIAGNOSIS — N938 Other specified abnormal uterine and vaginal bleeding: Secondary | ICD-10-CM | POA: Insufficient documentation

## 2014-03-22 DIAGNOSIS — N949 Unspecified condition associated with female genital organs and menstrual cycle: Secondary | ICD-10-CM | POA: Insufficient documentation

## 2014-03-22 DIAGNOSIS — N939 Abnormal uterine and vaginal bleeding, unspecified: Secondary | ICD-10-CM

## 2014-03-22 DIAGNOSIS — D259 Leiomyoma of uterus, unspecified: Secondary | ICD-10-CM | POA: Insufficient documentation

## 2014-03-22 DIAGNOSIS — N925 Other specified irregular menstruation: Secondary | ICD-10-CM | POA: Insufficient documentation

## 2014-03-25 ENCOUNTER — Telehealth: Payer: Self-pay | Admitting: *Deleted

## 2014-03-25 NOTE — Telephone Encounter (Addendum)
Message copied by Langston Reusing on Mon Mar 25, 2014  2:44 PM ------      Message from: Verita Schneiders A      Created: Mon Mar 25, 2014  1:10 PM       Pelvic ultrasound shows small fibroids.  Please call to inform patient of results.       ------  Called pt and informed her of [place] results. I explained what a fibroid is as pt was unaware. Pt stated she is not having any problems right now and voiced understanding of my information. Shelbey Spindler RNC

## 2014-03-26 ENCOUNTER — Telehealth: Payer: Self-pay

## 2014-03-26 NOTE — Telephone Encounter (Signed)
Pt. Called stating she went to [facility] to pick up her prescription and prescription was not there. Called [facility] who confirmed it was there and that they would get it filled for her. Called pt. To inform her that the prescription was in fact received and that they are filling it as we speak. Pt. Verbalized understanding and gratitude and had no further questions or concerns.

## 2014-03-27 ENCOUNTER — Telehealth: Payer: Self-pay | Admitting: *Deleted

## 2014-03-27 DIAGNOSIS — N939 Abnormal uterine and vaginal bleeding, unspecified: Secondary | ICD-10-CM

## 2014-03-27 MED ORDER — MEGESTROL ACETATE 40 MG PO TABS: 40.0000 mg | ORAL_TABLET | Freq: Two times a day (BID) | ORAL | Status: DC

## 2014-03-27 NOTE — Telephone Encounter (Signed)
Pt called nurse line requesting medication to be sent to [facility] due to the cost of medication at [facility],  Number provided to [facility]. [phone].  Called pharmacy, pharmacy closed, left message to call clinic concerning prescription request.

## 2014-03-27 NOTE — Telephone Encounter (Signed)
Contacted pharmacy and spoke with Megan Kelly, pharmacy updated in system and prescription e prescribed.

## 2014-03-28 ENCOUNTER — Telehealth: Payer: Self-pay | Admitting: *Deleted

## 2014-03-28 NOTE — Telephone Encounter (Addendum)
Patient called and requested for [place] to call to her pharmacy the medication Megace.    Attempted to call pharmacy, no answer, will call on 03/29/14.  4/10  3524 Called pt and she stated that the pharmacy has not received the prescription for Megace. I advised pt that I will call the pharmacy and leave the prescription by phone if I am unable to speak directly to the pharmacist. Pt voiced understanding. I called the pharmacy and left voice mail for pharmacist "Elta Guadeloupe". I gave all prescription information and stated that pt needs to pick up the medication today.  Diane Day RNC

## 2014-04-01 ENCOUNTER — Ambulatory Visit: Payer: Medicaid Other | Admitting: Obstetrics & Gynecology

## 2014-04-04 ENCOUNTER — Other Ambulatory Visit: Payer: Self-pay | Admitting: Obstetrics & Gynecology

## 2014-04-04 DIAGNOSIS — Z1231 Encounter for screening mammogram for malignant neoplasm of breast: Secondary | ICD-10-CM

## 2014-04-10 ENCOUNTER — Encounter: Payer: Self-pay | Admitting: *Deleted

## 2014-04-15 ENCOUNTER — Ambulatory Visit (HOSPITAL_COMMUNITY)
Admission: RE | Admit: 2014-04-15 | Discharge: 2014-04-15 | Disposition: A | Discharge status: Home / Self Care | Payer: No Typology Code available for payment source | Source: Ambulatory Visit | Attending: Obstetrics & Gynecology | Admitting: Obstetrics & Gynecology

## 2014-04-15 DIAGNOSIS — Z1231 Encounter for screening mammogram for malignant neoplasm of breast: Secondary | ICD-10-CM

## 2014-10-16 ENCOUNTER — Telehealth: Payer: Self-pay | Admitting: *Deleted

## 2014-10-16 DIAGNOSIS — N939 Abnormal uterine and vaginal bleeding, unspecified: Secondary | ICD-10-CM

## 2014-10-16 NOTE — Telephone Encounter (Signed)
Pt left message requesting refill of Megace. She would like it sent to the [facility]. Message sent to Dr. Harolyn Rutherford regarding refill request.

## 2014-10-18 MED ORDER — MEGESTROL ACETATE 40 MG PO TABS: 40.0000 mg | ORAL_TABLET | Freq: Two times a day (BID) | ORAL | Status: DC

## 2014-10-18 NOTE — Telephone Encounter (Signed)
Megace refilled, sent to [facility] as requested.

## 2014-10-21 ENCOUNTER — Encounter: Payer: Self-pay | Admitting: Obstetrics & Gynecology

## 2014-11-06 ENCOUNTER — Telehealth: Payer: Self-pay

## 2014-11-06 DIAGNOSIS — N939 Abnormal uterine and vaginal bleeding, unspecified: Secondary | ICD-10-CM

## 2014-11-06 MED ORDER — MEGESTROL ACETATE 40 MG PO TABS: 40.0000 mg | ORAL_TABLET | Freq: Two times a day (BID) | ORAL | Status: DC

## 2014-11-06 NOTE — Telephone Encounter (Signed)
Dr. Harolyn Rutherford approved refill. Patient informed.

## 2014-11-06 NOTE — Telephone Encounter (Signed)
Patient called requesting a RN call her. Called patient and she is requesting refill of Megace. C/o of real bad cramps and states "its hard for me to walk." Patient denies taking any pain reliever to help with cramping. Advised she try ibuprofen. Patient verbalized understanding. Message sent to Dr. Harolyn Rutherford regarding refill. Informed patient we will contact her if refill apprroved. Patient verbalized understanding and gratitude. No further questions or concerns.

## 2014-11-07 ENCOUNTER — Telehealth: Payer: Self-pay

## 2014-11-07 DIAGNOSIS — N939 Abnormal uterine and vaginal bleeding, unspecified: Secondary | ICD-10-CM

## 2014-11-07 NOTE — Telephone Encounter (Addendum)
Patient called stating [facility] does not carry megace and therefore cannot fill it. [place] who advised to call [facility] [phone]-- they will fill RX and send it to [facility] where patient can then pick it up. Attempted to contact pharmacy.After hitting option 2, phone continued to ring and ring. Unable to speak to pharmacy personnel. Will attempt again later.   [facility] personnel. They informed me they no longer carry megace either. Discussed patient with Dr. Ihor Dow, who states patient can try Provera 20mg  daily if it is cheaper. Called [facility] outpatient pharmacy and was informed it would cost $23.70 (10mg  tabs quantity 60). Megace at current dosage $28 (patient had been paying $14). Called [facility] and was informed Megace would cost $31.60 and Provera $20. Attempted to contact patient to discuss price and options. No answer. Left message stating trying to reach you regarding price and options for medication, please call clinic.

## 2014-11-08 MED ORDER — MEDROXYPROGESTERONE ACETATE 10 MG PO TABS: 20.0000 mg | ORAL_TABLET | Freq: Every day | ORAL | Status: DC

## 2014-11-08 NOTE — Telephone Encounter (Signed)
Called patient and informed her of price/options for Megace vs. Provera. Patient opts to try Provera 20mg  daily as she can obtain RX for $20 at [facility]. Medication e-prescribed. Patient denies any questions or concerns at this time. To follow up 11/27/14.

## 2014-11-27 ENCOUNTER — Ambulatory Visit (INDEPENDENT_AMBULATORY_CARE_PROVIDER_SITE_OTHER): Payer: Self-pay | Admitting: Obstetrics & Gynecology

## 2014-11-27 ENCOUNTER — Encounter: Payer: Self-pay | Admitting: Obstetrics & Gynecology

## 2014-11-27 VITALS — BP 114/70 | HR 57 | Temp 98.4°F | Ht 58.75 in | Wt 114.3 lb

## 2014-11-27 DIAGNOSIS — N939 Abnormal uterine and vaginal bleeding, unspecified: Secondary | ICD-10-CM

## 2014-11-27 MED ORDER — MEGESTROL ACETATE 20 MG PO TABS: 40.0000 mg | ORAL_TABLET | Freq: Two times a day (BID) | ORAL | Status: DC

## 2014-11-27 NOTE — Patient Instructions (Signed)
Return to clinic for any scheduled appointments or for any gynecologic concerns as needed.   

## 2014-11-27 NOTE — Progress Notes (Signed)
   CLINIC ENCOUNTER NOTE  History:  53 y.o. G2P1011 here today for follow up of AUB.  Has been taking Megace 10 mg bid and bleeding is not controlled.  Occasional cramping. No other symptoms. No current bleeding.  The following portions of the patient's history were reviewed and updated as appropriate: allergies, current medications, past family history, past medical history, past social history, past surgical history and problem list. Normal pap in 2013.  Normal mammogram on 04/15/14.   Review of Systems:  Pertinent items are noted in HPI.  Objective:  BP 114/70 mmHg  Pulse 57  Temp(Src) 98.4 F (36.9 C)  Ht 4' 10.75" (1.492 m)  Wt 114 lb 4.8 oz (51.846 kg)  BMI 23.29 kg/m2  LMP 10/21/2014 Physical Exam deferred  Assessment & Plan:  Patient is not being adequately dosed; recommended Megace 40 mg bid.  If she is unable to afford this, counseled about other modalities (IUD, endometrial ablation, hysterectomy).  She wants medical management for now, will reevaluate in one month. Bleeding precautions advised. Routine preventative health maintenance measures emphasized.   Verita Schneiders, MD, [place] Attending [place] for [employer], [place]

## 2015-01-06 ENCOUNTER — Ambulatory Visit: Payer: Medicaid Other | Admitting: Obstetrics & Gynecology

## 2015-02-12 ENCOUNTER — Ambulatory Visit: Payer: Medicaid Other | Admitting: Obstetrics & Gynecology

## 2015-04-29 ENCOUNTER — Other Ambulatory Visit: Payer: Self-pay | Admitting: Obstetrics & Gynecology

## 2015-11-28 IMAGING — CT CT HEAD W/O CM
1 series · 15 of 30 positions shown, 19 images · non-contrast
Comparison: Head CT January 24, 2009

CLINICAL DATA: Status post fall with laceration across the forehead
and bridge of the nose, right eyebrow.

EXAM:
CT HEAD WITHOUT CONTRAST
CT MAXILLOFACIAL WITHOUT CONTRAST
TECHNIQUE: Multidetector CT imaging of the head and maxillofacial structures
were performed using the standard protocol without intravenous
contrast. Multiplanar CT image reconstructions of the maxillofacial
structures were also generated.

[Series 2: head 4.8 h37s · axial · 0.46mm/px · z∈[-102,+31]mm · 15 of 32 slices shown, 19 images]
[im 2/32  brain]
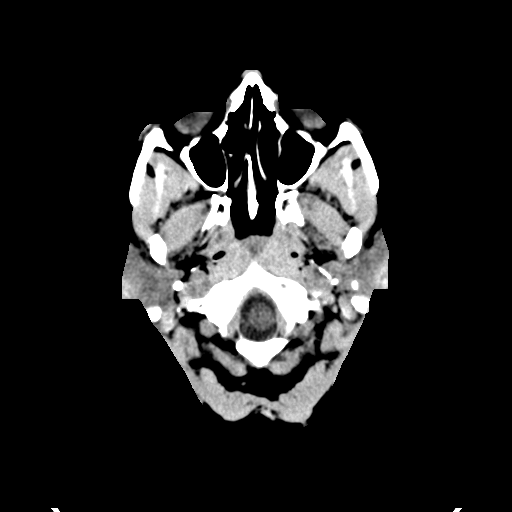
[im 2/32  bone]
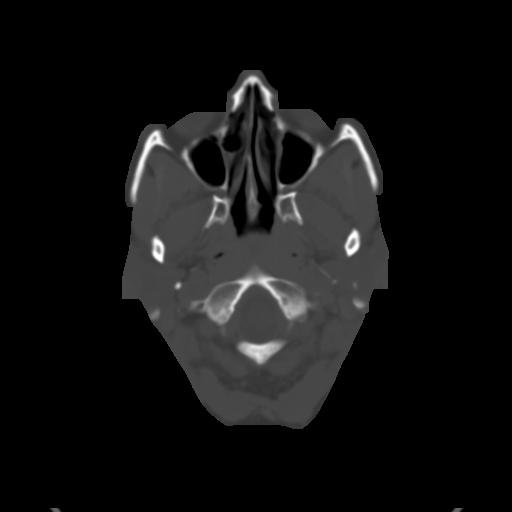
[im 4/32  brain]
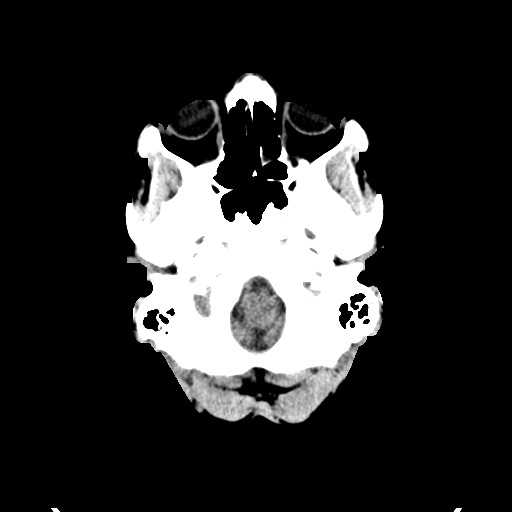
[im 6/32  brain]
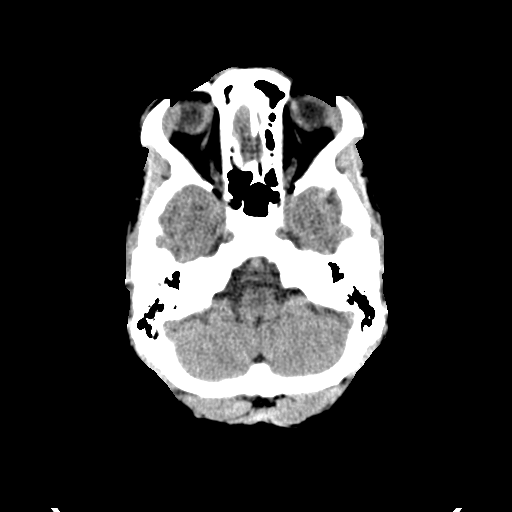
[im 8/32  brain]
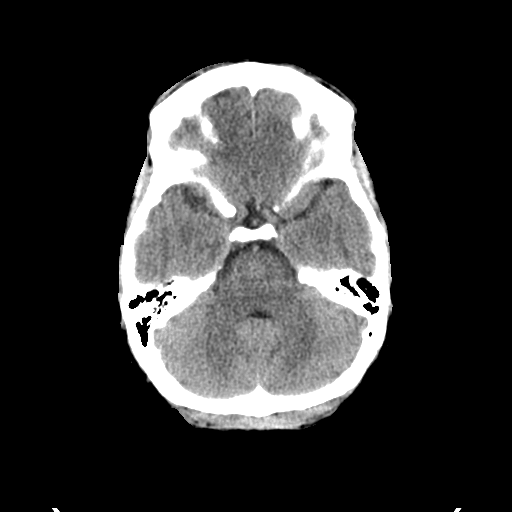
[im 10/32  brain]
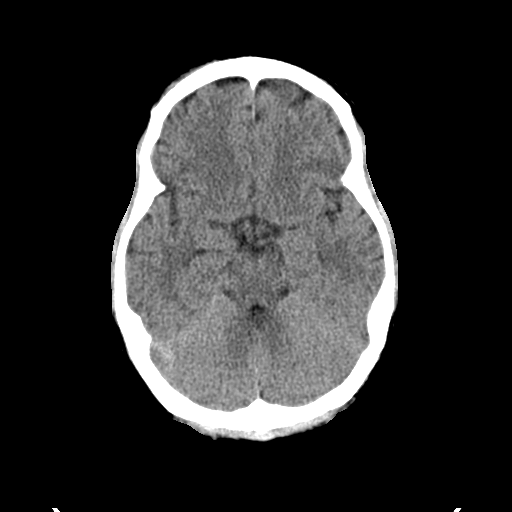
[im 10/32  bone]
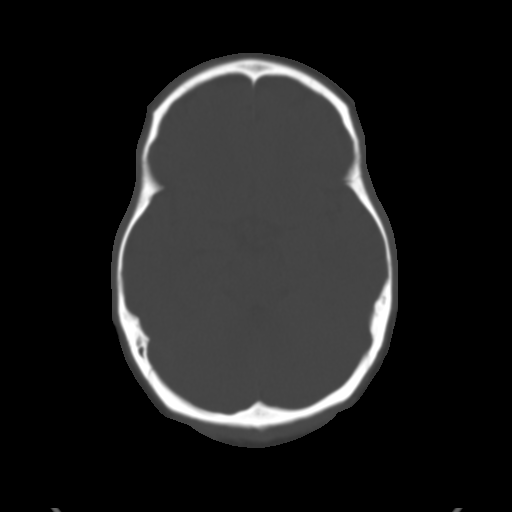
[im 12/32  brain]
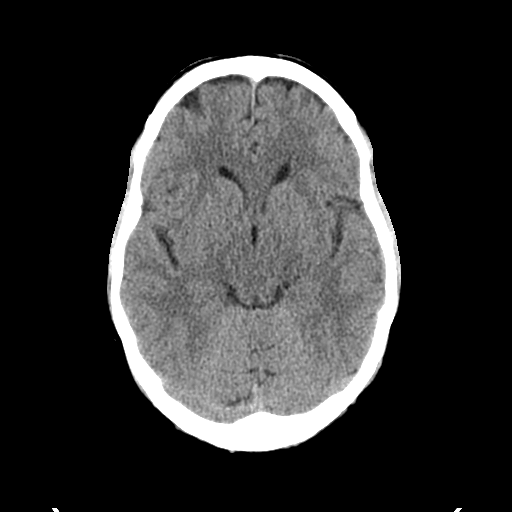
[im 14/32  brain]
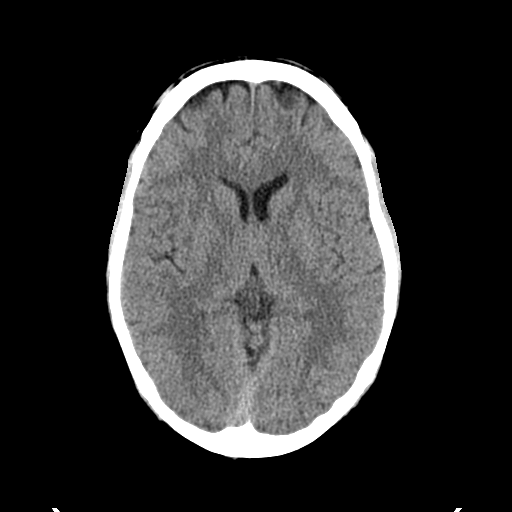
[im 17/32  brain]
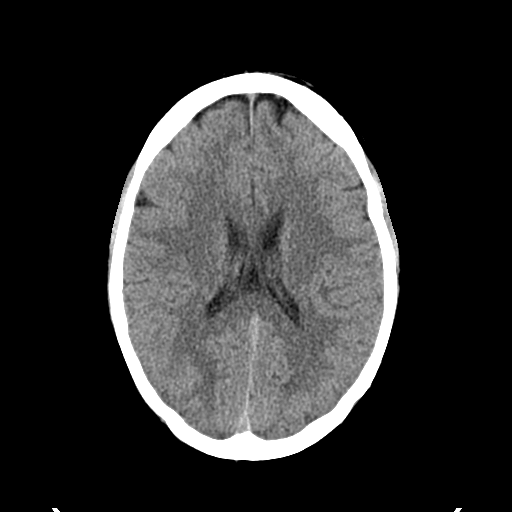
[im 18/32  brain]
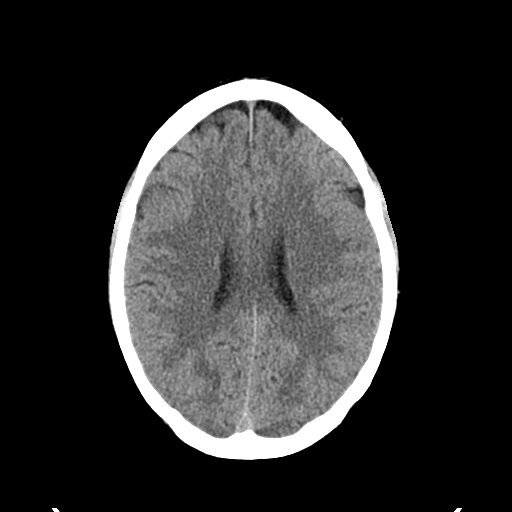
[im 18/32  bone]
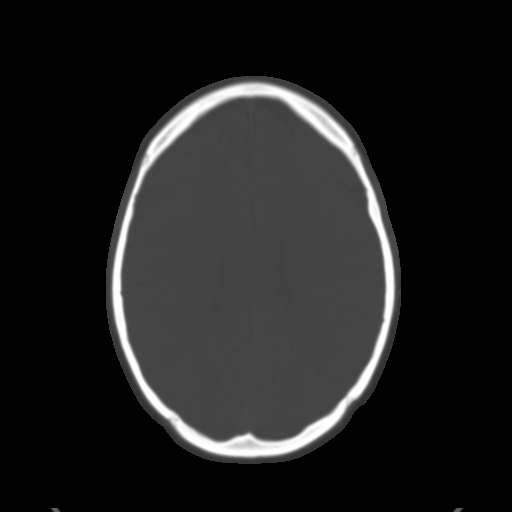
[im 20/32  brain]
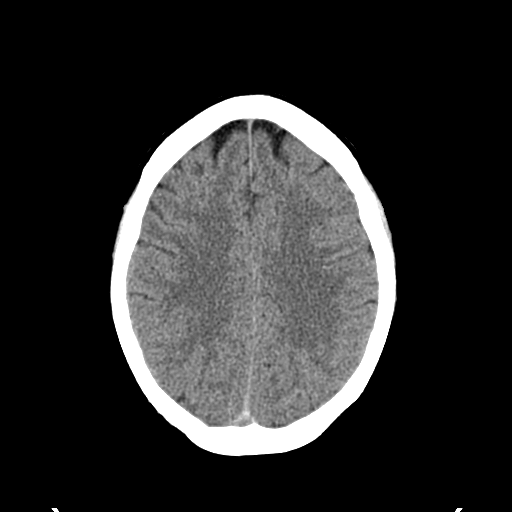
[im 22/32  brain]
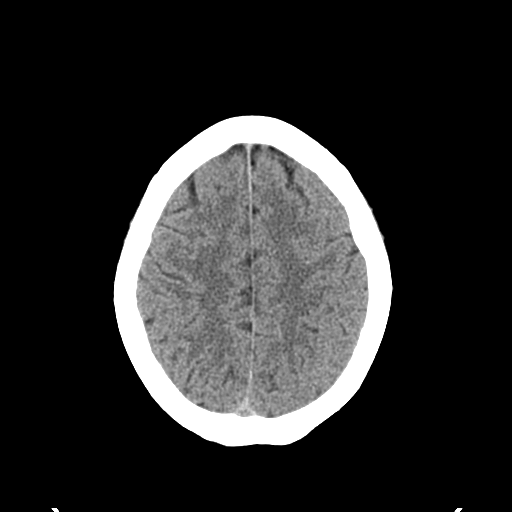
[im 24/32  brain]
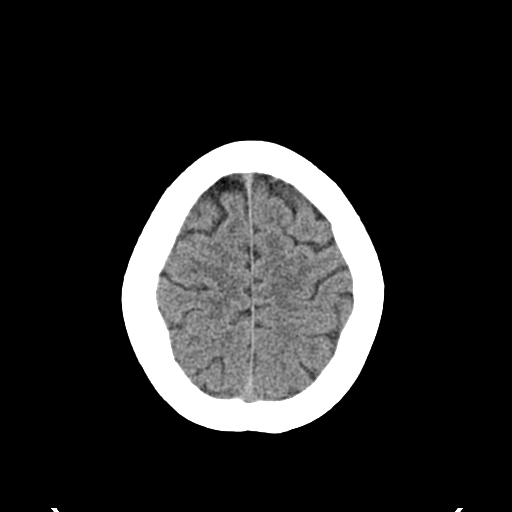
[im 26/32  brain]
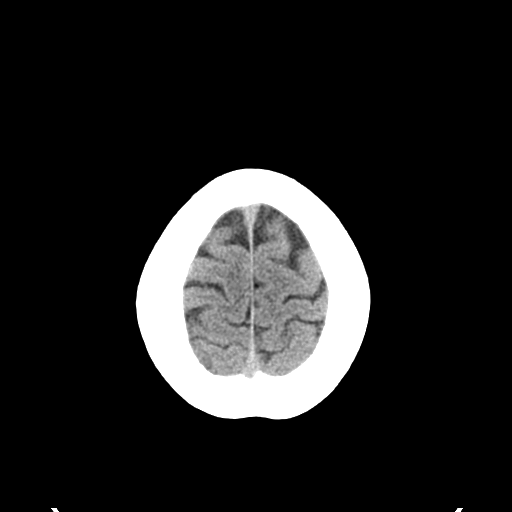
[im 26/32  bone]
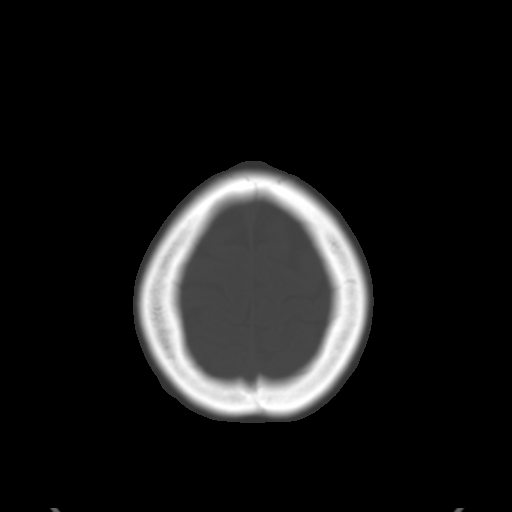
[im 28/32  brain]
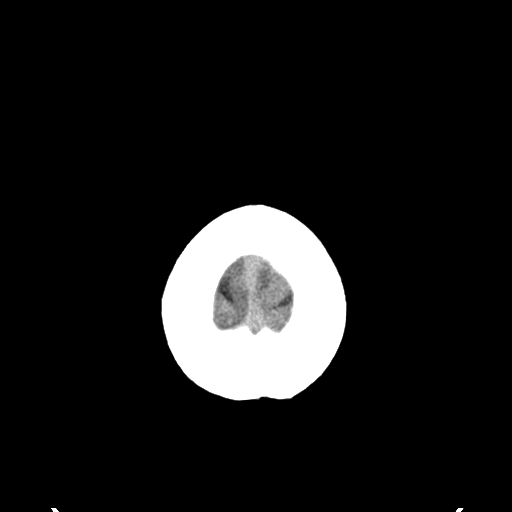
[im 30/32  brain]
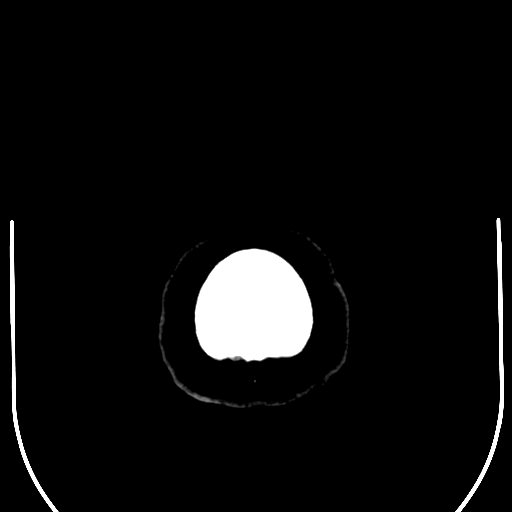

[15 of 30 positions shown; findings below may reference images not displayed]

FINDINGS: CT HEAD FINDINGS

There is no midline shift, hydrocephalus, or mass. No acute
hemorrhage or acute transcortical infarct is identified. The bony
calvarium is intact. There is mild mucoperiosteal thickening of the
left ethmoid sinus. There is soft tissue swelling over the right
frontal scalp and right cheek.

CT MAXILLOFACIAL FINDINGS

There is no acute fracture or dislocation. There is mild chronic
left to right nasal septal deviation. There is mucoperiosteal
thickening of the left maxillary and left ethmoid sinus without
air-fluid levels. The right ostiomeatal complexes patent. The left
ostial meatal complex is opacified. The orbits are normal. There is
mild soft tissue swelling in the right frontal scalp and right
lateral cheek.
IMPRESSION: No focal acute intracranial abnormality identified.

No acute fracture or dislocation of maxillofacial bones. Soft tissue
swelling of the right frontal scalp and right lateral cheek.

Mucoperiosteal thickening of the left maxillary and left ethmoid
sinus without evidence of acute sinusitis.

## 2016-11-13 ENCOUNTER — Encounter (HOSPITAL_BASED_OUTPATIENT_CLINIC_OR_DEPARTMENT_OTHER): Payer: Self-pay | Admitting: Adult Health

## 2016-11-13 ENCOUNTER — Emergency Department (HOSPITAL_BASED_OUTPATIENT_CLINIC_OR_DEPARTMENT_OTHER)
Admission: EM | Admit: 2016-11-13 | Discharge: 2016-11-13 | Disposition: A | Discharge status: Home / Self Care | Payer: Medicaid Other | Attending: Emergency Medicine | Admitting: Emergency Medicine

## 2016-11-13 DIAGNOSIS — M791 Myalgia, unspecified site: Secondary | ICD-10-CM

## 2016-11-13 DIAGNOSIS — Z79899 Other long term (current) drug therapy: Secondary | ICD-10-CM | POA: Insufficient documentation

## 2016-11-13 DIAGNOSIS — B370 Candidal stomatitis: Secondary | ICD-10-CM | POA: Insufficient documentation

## 2016-11-13 DIAGNOSIS — I10 Essential (primary) hypertension: Secondary | ICD-10-CM | POA: Insufficient documentation

## 2016-11-13 DIAGNOSIS — Z853 Personal history of malignant neoplasm of breast: Secondary | ICD-10-CM | POA: Insufficient documentation

## 2016-11-13 DIAGNOSIS — J029 Acute pharyngitis, unspecified: Secondary | ICD-10-CM

## 2016-11-13 DIAGNOSIS — F17210 Nicotine dependence, cigarettes, uncomplicated: Secondary | ICD-10-CM | POA: Insufficient documentation

## 2016-11-13 LAB — COMPREHENSIVE METABOLIC PANEL
ALT: 30 U/L (ref 14–54)
AST: 31 U/L (ref 15–41)
Albumin: 3.9 g/dL (ref 3.5–5.0)
Alkaline Phosphatase: 86 U/L (ref 38–126)
Anion gap: 8 (ref 5–15)
BILIRUBIN TOTAL: 0.4 mg/dL (ref 0.3–1.2)
BUN: 8 mg/dL (ref 6–20)
CHLORIDE: 105 mmol/L (ref 101–111)
CO2: 24 mmol/L (ref 22–32)
CREATININE: 0.71 mg/dL (ref 0.44–1.00)
Calcium: 9 mg/dL (ref 8.9–10.3)
Glucose, Bld: 87 mg/dL (ref 65–99)
Potassium: 3.2 mmol/L — ABNORMAL LOW (ref 3.5–5.1)
Sodium: 137 mmol/L (ref 135–145)
TOTAL PROTEIN: 7.7 g/dL (ref 6.5–8.1)

## 2016-11-13 LAB — CBC WITH DIFFERENTIAL/PLATELET
BASOS ABS: 0 10*3/uL (ref 0.0–0.1)
BASOS PCT: 0 %
EOS ABS: 0.2 10*3/uL (ref 0.0–0.7)
EOS PCT: 1 %
HCT: 39.7 % (ref 36.0–46.0)
HEMOGLOBIN: 13.4 g/dL (ref 12.0–15.0)
LYMPHS ABS: 2.9 10*3/uL (ref 0.7–4.0)
Lymphocytes Relative: 20 %
MCH: 30 pg (ref 26.0–34.0)
MCHC: 33.8 g/dL (ref 30.0–36.0)
MCV: 88.8 fL (ref 78.0–100.0)
Monocytes Absolute: 1.4 10*3/uL — ABNORMAL HIGH (ref 0.1–1.0)
Monocytes Relative: 10 %
NEUTROS PCT: 69 %
Neutro Abs: 9.9 10*3/uL — ABNORMAL HIGH (ref 1.7–7.7)
PLATELETS: 252 10*3/uL (ref 150–400)
RBC: 4.47 MIL/uL (ref 3.87–5.11)
RDW: 14.5 % (ref 11.5–15.5)
WBC: 14.4 10*3/uL — AB (ref 4.0–10.5)

## 2016-11-13 LAB — RAPID HIV SCREEN (HIV 1/2 AB+AG)
HIV 1/2 ANTIBODIES: NONREACTIVE
HIV-1 P24 ANTIGEN - HIV24: NONREACTIVE

## 2016-11-13 LAB — RAPID STREP SCREEN (MED CTR MEBANE ONLY): STREPTOCOCCUS, GROUP A SCREEN (DIRECT): NEGATIVE

## 2016-11-13 MED ORDER — CLOTRIMAZOLE 10 MG MT TROC
10.0000 mg | Freq: Every day | OROMUCOSAL | 0 refills | Status: DC
Start: 2016-11-13 — End: 2017-12-31

## 2016-11-13 MED ORDER — IBUPROFEN 600 MG PO TABS: 600.0000 mg | ORAL_TABLET | Freq: Four times a day (QID) | ORAL | 0 refills | Status: DC | PRN

## 2016-11-13 MED ORDER — IBUPROFEN 400 MG PO TABS
600.0000 mg | ORAL_TABLET | Freq: Once | ORAL | Status: AC
  Administered 2016-11-13: 600 mg via ORAL
  Filled 2016-11-13: qty 1

## 2016-11-13 NOTE — ED Notes (Signed)
EDP into room 

## 2016-11-13 NOTE — ED Provider Notes (Signed)
[facility] DEPT [facility] Provider Note   CSN: [id number] Arrival date & time: 11/13/16  1840  By signing my name below, I, [name], attest that this documentation has been prepared under the direction and in the presence of Charlesetta Shanks, MD . Electronically Signed: [name], Scribe. 11/13/2016. 8:54 PM.  History   Chief Complaint Chief Complaint  Patient presents with  . multiple complaints   The history is provided by the patient. No language interpreter was used.   HPI Comments: Megan Kelly is a 55 y.o. female who presents to the Emergency Department complaining of a constant moderate sore throat that started this morning. She states she had pain upon waking up this morning. She describes the pain as exacerbated by swallowing. She reports associated symptoms of bilateral ear pain, generalized myalgias, and productive cough (onset yesterday).Patient does however describe a chronic condition whereby when she is eating and swallowing she gets an itching, aggravating feeling back in her throat and in her ears. Because of this she scratches and rubs the area right in front of her ear and has 2 hyperpigmented callus-like areas. Patient reports she takes a daily PPI. She admits to having hepatitis C without current treatment. She reports diaphoresis at night and a right flank rash prior to today's episode. Her friend told her that her rash was shingles. It's getting better now. She denies any trouble swallowing, fever, nasal congestion, dental problem, or abdominal pain.     PCP: [name], NP  Past Medical History:  Diagnosis Date  . Cancer ([place])   . Hepatitis C   . Herpes   . Herpes simplex type 2 infection   . Hypertension   . Menorrhagia     Patient Active Problem List   Diagnosis Date Noted  . Pap smear with human papillomavirus (HPV) positive, normal cytology 03/19/2014  . History of breast cancer 03/14/2014  . Abnormal uterine bleeding (AUB) 03/14/2014  .  Papilloma of breast 10/11/2012    Past Surgical History:  Procedure Laterality Date  . BREAST LUMPECTOMY WITH NEEDLE LOCALIZATION  11/08/2012   Procedure: BREAST LUMPECTOMY WITH NEEDLE LOCALIZATION;  Surgeon: Harl Bowie, MD;  Location: [facility];  Service: General;  Laterality: Right;  . BREAST SURGERY    . NO PAST SURGERIES      OB History    Gravida Para Term Preterm AB Living   2 1 1  0 1 1   SAB TAB Ectopic Multiple Live Births   0 1             Home Medications    Prior to Admission medications   Medication Sig Start Date End Date Taking? Authorizing Provider  busPIRone (BUSPAR) 15 MG tablet Take 15 mg by mouth 2 (two) times daily.   Yes Historical Provider, MD  cloNIDine (CATAPRES) 0.2 MG tablet Take 0.2 mg by mouth daily.   Yes Historical Provider, MD  ferrous sulfate 325 (65 FE) MG tablet Take 325 mg by mouth 2 (two) times daily with a meal.   Yes Historical Provider, MD  fluticasone (VERAMYST) 27.5 MCG/SPRAY nasal spray Place 2 sprays into the nose daily.   Yes Historical Provider, MD  gabapentin (NEURONTIN) 300 MG capsule Take 300 mg by mouth 3 (three) times daily.   Yes Historical Provider, MD  lisinopril (PRINIVIL,ZESTRIL) 20 MG tablet Take 20 mg by mouth daily.   Yes Historical Provider, MD  oxybutynin (DITROPAN) 5 MG tablet Take 5 mg by mouth 1 day or 1 dose.  Yes Historical Provider, MD  pantoprazole (PROTONIX) 40 MG tablet Take 40 mg by mouth daily.   Yes Historical Provider, MD  PARoxetine (PAXIL) 20 MG tablet Take 20 mg by mouth daily.   Yes Historical Provider, MD  clotrimazole (MYCELEX) 10 MG troche Take 1 tablet (10 mg total) by mouth 5 (five) times daily. 11/13/16   Charlesetta Shanks, MD  cyclobenzaprine (FLEXERIL) 10 MG tablet Take 1 tablet (10 mg total) by mouth 2 (two) times daily as needed for muscle spasms. 12/28/13   Kaitlyn Szekalski, PA-C  ferrous fumarate (HEMOCYTE - 106 MG FE) 325 (106 FE) MG TABS tablet Take 1 tablet by mouth.    Historical Provider,  MD  ibuprofen (ADVIL,MOTRIN) 600 MG tablet Take 1 tablet (600 mg total) by mouth every 6 (six) hours as needed. 11/13/16   Charlesetta Shanks, MD  lisinopril (PRINIVIL,ZESTRIL) 10 MG tablet Take 10 mg by mouth daily.    Historical Provider, MD  megestrol (MEGACE) 20 MG tablet TAKE TWO TABLETS BY MOUTH TWICE DAILY 04/29/15   Osborne Oman, MD  ondansetron (ZOFRAN ODT) 8 MG disintegrating tablet 8mg  ODT q4 hours prn nausea Patient not taking: Reported on 11/27/2014 09/12/13   Veryl Speak, MD    Family History Family History  Problem Relation Age of Onset  . Diabetes Mother   . Hypertension Mother   . Heart disease Father     Social History Social History  Substance Use Topics  . Smoking status: Current Every Day Smoker    Packs/day: 0.25    Years: 15.00    Types: Cigarettes  . Smokeless tobacco: Not on file     Comment: smoked for 15 yrs  . Alcohol use No     Comment: quit 2 yrs ago     Allergies   Patient has no known allergies.   Review of Systems Review of Systems  Constitutional: Negative for fever.  HENT: Positive for ear pain (Bilateral) and sore throat. Negative for congestion (Nasal), dental problem and trouble swallowing.   Respiratory: Positive for cough (productive).   Gastrointestinal: Negative for abdominal pain.  Musculoskeletal: Positive for myalgias (Generalized).  Skin: Positive for rash (Left flank, onset before episode).  10 Systems reviewed and are negative for acute change except as noted in the HPI.    Physical Exam Updated Vital Signs BP 155/95 (BP Location: Left Arm)   Pulse 66   Temp 97.8 F (36.6 C) (Oral)   Resp 18   Ht 4\' 10"  (1.473 m)   Wt 126 lb (57.2 kg)   SpO2 97%   BMI 26.33 kg/m   Physical Exam  Constitutional: She is oriented to person, place, and time. She appears well-developed and well-nourished. No distress.  HENT:  Head: Normocephalic.  Bilateral TMs normal without erythema or bulging. Posterior oropharynx is widely  patent there is not erythema or exudate. Tongue has whitish plaque. No other intraoral lesions of note. The patient does have a hyperpigmented callus-like areas approximately a centimeter each just in front of the ear and at about the angle of the mandible on each side.   Eyes: Conjunctivae are normal.  Neck: Neck supple.  Palpation of the neck is normal without any cervical lymphadenopathy. No appreciable mass. Patient does endorse discomfort to palpation of the soft tissues of the anterior neck. No stridor.  Cardiovascular: Normal rate and regular rhythm.  Exam reveals no gallop and no friction rub.   No murmur heard. Pulmonary/Chest: Effort normal. No respiratory distress. She has wheezes.  She has no rales.  Occasional bilateral end expiratory wheeze. Left chest wall, patient does have several hypopigmented lesions with some crusting that appear consistent with resolving shingles.  Abdominal: Soft. She exhibits no distension. There is no tenderness.  Musculoskeletal: Normal range of motion. She exhibits no edema, tenderness or deformity.  Neurological: She is alert and oriented to person, place, and time. No cranial nerve deficit. She exhibits normal muscle tone. Coordination normal.  Skin: Skin is warm and dry. Rash noted.  Resolving hypopigmented lesions with slight scaling on the left lateral chest wall consistent with resolving  Psychiatric: She has a normal mood and affect.  Nursing note and vitals reviewed.    ED Treatments / Results  DIAGNOSTIC STUDIES: Oxygen Saturation is 97% on RA, normal by my interpretation.    COORDINATION OF CARE: 8:58 PM- Pt advised of plan for treatment and pt agrees.  Labs (all labs ordered are listed, but only abnormal results are displayed) Labs Reviewed  COMPREHENSIVE METABOLIC PANEL - Abnormal; Notable for the following:       Result Value   Potassium 3.2 (*)    All other components within normal limits  CBC WITH DIFFERENTIAL/PLATELET -  Abnormal; Notable for the following:    WBC 14.4 (*)    Neutro Abs 9.9 (*)    Monocytes Absolute 1.4 (*)    All other components within normal limits  RAPID STREP SCREEN (NOT AT [facility])  CULTURE, GROUP A STREP ([place])  RAPID HIV SCREEN (HIV 1/2 AB+AG)    EKG  EKG Interpretation None       Radiology No results found.  Procedures Procedures (including critical care time)  Medications Ordered in ED Medications  ibuprofen (ADVIL,MOTRIN) tablet 600 mg (not administered)     Initial Impression / Assessment and Plan / ED Course  I have reviewed the triage vital signs and the nursing notes.  Pertinent labs & imaging results that were available during my care of the patient were reviewed by me and considered in my medical decision making (see chart for details).  Clinical Course     Final Clinical Impressions(s) / ED Diagnoses   Final diagnoses:  Thrush, oral  Pharyngitis, unspecified etiology  Myalgia  Patient has a unusual presentation of pharyngitis type symptoms. She describes symptoms as just starting today but then on the other hand has 2 hyperpigmented areas from repeatedly rubbing the size of her face due to itching discomfort type sensation with chewing and eating. There is no mass evident on her neck exam and no lymphadenopathy. Patient does have whitish plaque on the tongue. I suspect oral thrush and possible candidal esophagitis based on her symptoms. The patient does have hep C. HIV was tested and found to be negative. I had some concern for autoimmune condition given the recent shingles outbreak. Patient however is nontoxic and alert. At this time we'll initiate treatment with Mycelex troches and ibuprofen for pain. She is advised onset seemed close follow-up with her PCP.  New Prescriptions New Prescriptions   CLOTRIMAZOLE (MYCELEX) 10 MG TROCHE    Take 1 tablet (10 mg total) by mouth 5 (five) times daily.   IBUPROFEN (ADVIL,MOTRIN) 600 MG TABLET    Take 1 tablet  (600 mg total) by mouth every 6 (six) hours as needed.       Charlesetta Shanks, MD 11/13/16 2237

## 2016-11-13 NOTE — ED Triage Notes (Addendum)
Presents with throat pan that is worse with swallowing and left ear pain for the past few days, also c/o bilateral foot pain on bottom of feet began a few weeks ago. Denies fevers. Throat red,

## 2016-11-16 LAB — CULTURE, GROUP A STREP (THRC)

## 2017-05-26 ENCOUNTER — Emergency Department (HOSPITAL_BASED_OUTPATIENT_CLINIC_OR_DEPARTMENT_OTHER)
Admission: EM | Admit: 2017-05-26 | Discharge: 2017-05-26 | Disposition: A | Discharge status: Home / Self Care | Payer: Self-pay | Attending: Emergency Medicine | Admitting: Emergency Medicine

## 2017-05-26 ENCOUNTER — Emergency Department (HOSPITAL_BASED_OUTPATIENT_CLINIC_OR_DEPARTMENT_OTHER): Payer: Self-pay

## 2017-05-26 ENCOUNTER — Encounter (HOSPITAL_BASED_OUTPATIENT_CLINIC_OR_DEPARTMENT_OTHER): Payer: Self-pay

## 2017-05-26 DIAGNOSIS — J209 Acute bronchitis, unspecified: Secondary | ICD-10-CM | POA: Insufficient documentation

## 2017-05-26 DIAGNOSIS — F17210 Nicotine dependence, cigarettes, uncomplicated: Secondary | ICD-10-CM | POA: Insufficient documentation

## 2017-05-26 DIAGNOSIS — J40 Bronchitis, not specified as acute or chronic: Secondary | ICD-10-CM

## 2017-05-26 DIAGNOSIS — I10 Essential (primary) hypertension: Secondary | ICD-10-CM | POA: Insufficient documentation

## 2017-05-26 DIAGNOSIS — Z79899 Other long term (current) drug therapy: Secondary | ICD-10-CM | POA: Insufficient documentation

## 2017-05-26 MED ORDER — PREDNISONE 20 MG PO TABS: ORAL_TABLET | ORAL | 0 refills | Status: DC

## 2017-05-26 MED ORDER — BENZONATATE 100 MG PO CAPS: 100.0000 mg | ORAL_CAPSULE | Freq: Three times a day (TID) | ORAL | 0 refills | Status: DC

## 2017-05-26 MED ORDER — ALBUTEROL SULFATE HFA 108 (90 BASE) MCG/ACT IN AERS: 1.0000 | INHALATION_SPRAY | Freq: Four times a day (QID) | RESPIRATORY_TRACT | 0 refills | Status: AC | PRN

## 2017-05-26 MED FILL — predniSONE 20 MG TABS: 20 | 5 days supply | Qty: 11 | Fill #0

## 2017-05-26 MED FILL — PROVENTIL HFA 90 MCG INH: 108 (90 BAS | 25 days supply | Qty: 7 | Fill #0

## 2017-05-26 MED FILL — BENZONATATE 100 MG CAP: 100 | 7 days supply | Qty: 21 | Fill #0

## 2017-05-26 NOTE — Discharge Instructions (Signed)
Use albuterol inhaler 2 puffs every 4 hrs as needed for shortness of breath.  Avoid tobacco use.  Take tylenol/ibuprofen as needed for fever/chills.

## 2017-05-26 NOTE — ED Notes (Signed)
Pt directed to pharmacy to pick up RX 

## 2017-05-26 NOTE — ED Provider Notes (Signed)
[facility] DEPT [facility] Provider Note   CSN: [id number] Arrival date & time: 05/26/17  1510  By signing my name below, I, [name], attest that this documentation has been prepared under the direction and in the presence of non-physician practitioner, Domenic Moras, PA-C. Electronically Signed: [name], Scribe. 05/26/2017. 4:54 PM.  History   Chief Complaint Chief Complaint  Patient presents with  . Cough   The history is provided by the patient. No language interpreter was used.   HPI Comments: Megan Kelly is a 56 y.o. female who presents to the Emergency Department complaining of intermittent moderate cough that started about 2 days ago. She states she has been having gradually worsening URI-like symptoms. She describes the cough as productive of phelgm. She reports associated rhinorrhea, congestion, sore throat, nausea, diarrhea, generalized myalgias, and chills. Her symptoms have been worsening. She admits to sick contacts and a hx of smoking (>1 PPD). She had her influenza immunization this year. Denies any fever, vomiting, or other complaints at this time.  Past Medical History:  Diagnosis Date  . Cancer ([place])   . Hepatitis C   . Herpes   . Herpes simplex type 2 infection   . Hypertension   . Menorrhagia     Patient Active Problem List   Diagnosis Date Noted  . Pap smear with human papillomavirus (HPV) positive, normal cytology 03/19/2014  . History of breast cancer 03/14/2014  . Abnormal uterine bleeding (AUB) 03/14/2014  . Papilloma of breast 10/11/2012    Past Surgical History:  Procedure Laterality Date  . BREAST LUMPECTOMY WITH NEEDLE LOCALIZATION  11/08/2012   Procedure: BREAST LUMPECTOMY WITH NEEDLE LOCALIZATION;  Surgeon: Harl Berneta Sconyers, MD;  Location: [facility];  Service: General;  Laterality: Right;  . BREAST SURGERY    . NO PAST SURGERIES      OB History    Gravida Para Term Preterm AB Living   2 1 1  0 1 1   SAB TAB Ectopic Multiple Live  Births   0 1             Home Medications    Prior to Admission medications   Medication Sig Start Date End Date Taking? Authorizing Provider  busPIRone (BUSPAR) 15 MG tablet Take 15 mg by mouth 2 (two) times daily.    [provider]  cloNIDine (CATAPRES) 0.2 MG tablet Take 0.2 mg by mouth daily.    [provider]  clotrimazole (MYCELEX) 10 MG troche Take 1 tablet (10 mg total) by mouth 5 (five) times daily. 11/13/16   Charlesetta Shanks, MD  cyclobenzaprine (FLEXERIL) 10 MG tablet Take 1 tablet (10 mg total) by mouth 2 (two) times daily as needed for muscle spasms. 12/28/13   Szekalski, Verline Lema, PA-C  ferrous fumarate (HEMOCYTE - 106 MG FE) 325 (106 FE) MG TABS tablet Take 1 tablet by mouth.    [provider]  ferrous sulfate 325 (65 FE) MG tablet Take 325 mg by mouth 2 (two) times daily with a meal.    [provider]  fluticasone (VERAMYST) 27.5 MCG/SPRAY nasal spray Place 2 sprays into the nose daily.    [provider]  gabapentin (NEURONTIN) 300 MG capsule Take 300 mg by mouth 3 (three) times daily.    [provider]  ibuprofen (ADVIL,MOTRIN) 600 MG tablet Take 1 tablet (600 mg total) by mouth every 6 (six) hours as needed. 11/13/16   Charlesetta Shanks, MD  lisinopril (PRINIVIL,ZESTRIL) 10 MG tablet Take 10 mg by  mouth daily.    [provider]  lisinopril (PRINIVIL,ZESTRIL) 20 MG tablet Take 20 mg by mouth daily.    [provider]  megestrol (MEGACE) 20 MG tablet TAKE TWO TABLETS BY MOUTH TWICE DAILY 04/29/15   Anyanwu, Sallyanne Havers, MD  ondansetron (ZOFRAN ODT) 8 MG disintegrating tablet 8mg  ODT q4 hours prn nausea Patient not taking: Reported on 11/27/2014 09/12/13   Veryl Speak, MD  oxybutynin (DITROPAN) 5 MG tablet Take 5 mg by mouth 1 day or 1 dose.    [provider]  pantoprazole (PROTONIX) 40 MG tablet Take 40 mg by mouth daily.    [provider]  PARoxetine (PAXIL) 20 MG tablet Take 20 mg by  mouth daily.    [provider]    Family History Family History  Problem Relation Age of Onset  . Diabetes Mother   . Hypertension Mother   . Heart disease Father     Social History Social History  Substance Use Topics  . Smoking status: Current Every Day Smoker    Packs/day: 0.25    Years: 15.00    Types: Cigarettes  . Smokeless tobacco: Never Used     Comment: smoked for 15 yrs  . Alcohol use No     Allergies   Patient has no known allergies.   Review of Systems Review of Systems  Constitutional: Positive for chills. Negative for fever.  HENT: Positive for congestion, rhinorrhea and sore throat.   Respiratory: Positive for cough.   Gastrointestinal: Positive for diarrhea and nausea. Negative for vomiting.  Musculoskeletal: Positive for myalgias (Generalized).     Physical Exam Updated Vital Signs BP 131/84 (BP Location: Left Arm)   Pulse 70   Temp 98.3 F (36.8 C) (Oral)   Resp 18   Ht 4\' 10"  (1.473 m)   Wt 126 lb (57.2 kg)   SpO2 99%   BMI 26.33 kg/m   Physical Exam  Constitutional: She appears well-developed and well-nourished. No distress.  HENT:  Head: Normocephalic.  Right Ear: Tympanic membrane, external ear and ear canal normal.  Left Ear: Tympanic membrane, external ear and ear canal normal.  Nose: Nose normal.  Mouth/Throat: Uvula is midline and oropharynx is clear and moist.  Eyes: Conjunctivae are normal.  Neck: Neck supple.  Trachea midline.   Cardiovascular: Normal rate, regular rhythm and normal heart sounds.   Pulmonary/Chest: Effort normal and breath sounds normal. No respiratory distress. She has no wheezes. She has no rales.  Abdominal: Soft.  Musculoskeletal: Normal range of motion.  Lymphadenopathy:    She has no cervical adenopathy.  Neurological: She is alert.  Skin: Skin is warm and dry.  Psychiatric: She has a normal mood and affect.  Nursing note and vitals reviewed.    ED Treatments / Results  DIAGNOSTIC  STUDIES: Oxygen Saturation is 99% on RA, normal by my interpretation.    COORDINATION OF CARE: 4:58 PM- Pt advised of plan for treatment and pt agrees.  Labs (all labs ordered are listed, but only abnormal results are displayed) Labs Reviewed - No data to display  EKG  EKG Interpretation None       Radiology Dg Chest 2 View  Result Date: 05/26/2017 CLINICAL DATA:  Three days of cough. History of breast malignancy, hypertension, current smoker. EXAM: CHEST  2 VIEW COMPARISON:  Chest x-ray of December 23, 2011 FINDINGS: The lungs are well-expanded. There is no focal infiltrate. There is no pleural effusion. There is subtle linear density peripherally in  the left mid lung in a subpleural location. The heart and pulmonary vascularity are normal. The mediastinum is normal in width. The bony thorax exhibits no acute abnormality. There is calcification in the wall of the aortic arch. IMPRESSION: There is no pneumonia. Mild interstitial prominence likely reflects bronchitic-smoking related changes. Thoracic aortic atherosclerosis. Electronically Signed   By: David  Martinique M.D.   On: 05/26/2017 17:26    Procedures Procedures (including critical care time)  Medications Ordered in ED Medications - No data to display   Initial Impression / Assessment and Plan / ED Course  I have reviewed the triage vital signs and the nursing notes.  Pertinent labs & imaging results that were available during my care of the patient were reviewed by me and considered in my medical decision making (see chart for details).     BP 131/84 (BP Location: Left Arm)   Pulse 70   Temp 98.3 F (36.8 C) (Oral)   Resp 18   Ht 4\' 10"  (1.473 m)   Wt 57.2 kg (126 lb)   SpO2 99%   BMI 26.33 kg/m   Pt CXR negative for acute infiltrate. Patients symptoms are consistent with URI, likely viral etiology. Suspect bronchitis. Discussed that antibiotics are not indicated for viral infections. Pt will be discharged with  symptomatic treatment.  Verbalizes understanding and is agreeable with plan. Pt is hemodynamically stable & in NAD prior to dc.   Final Clinical Impressions(s) / ED Diagnoses   Final diagnoses:  Bronchitis    New Prescriptions New Prescriptions   ALBUTEROL (PROVENTIL HFA;VENTOLIN HFA) 108 (90 BASE) MCG/ACT INHALER    Inhale 1-2 puffs into the lungs every 6 (six) hours as needed for wheezing or shortness of breath.   BENZONATATE (TESSALON) 100 MG CAPSULE    Take 1 capsule (100 mg total) by mouth every 8 (eight) hours.   PREDNISONE (DELTASONE) 20 MG TABLET    3 tabs po day one, then 2 tabs daily x 4 days   I personally performed the services described in this documentation, which was scribed in my presence. The recorded information has been reviewed and is accurate.       Domenic Moras, PA-C 05/26/17 1735    Fredia Sorrow, MD 05/27/17 [phone]

## 2017-05-26 NOTE — ED Triage Notes (Signed)
C/o flu like sx x 3 days-NAD-steady gait 

## 2017-06-06 ENCOUNTER — Telehealth: Payer: Self-pay | Admitting: *Deleted

## 2017-06-06 ENCOUNTER — Encounter: Payer: Self-pay | Admitting: Internal Medicine

## 2017-06-06 ENCOUNTER — Ambulatory Visit (INDEPENDENT_AMBULATORY_CARE_PROVIDER_SITE_OTHER): Payer: Self-pay | Admitting: Internal Medicine

## 2017-06-06 VITALS — Temp 98.0°F | Ht 58.75 in | Wt 120.0 lb

## 2017-06-06 DIAGNOSIS — Z23 Encounter for immunization: Secondary | ICD-10-CM

## 2017-06-06 DIAGNOSIS — B182 Chronic viral hepatitis C: Secondary | ICD-10-CM

## 2017-06-06 MED ORDER — GLECAPREVIR-PIBRENTASVIR 100-40 MG PO TABS: 3.0000 | ORAL_TABLET | Freq: Every day | ORAL | 1 refills | Status: DC

## 2017-06-06 NOTE — Telephone Encounter (Signed)
Called patient and left a voice mail to inform her of ultrasound at [facility] on 06/16/17 at 7:45 AM. Nothing to eat or drink after midnight. Asked that she call back to confirm and if not a good time; she can call [phone] to reschedule. Myrtis Hopping

## 2017-06-06 NOTE — Addendum Note (Signed)
Addended by: Myrtis Hopping A on: 06/06/2017 11:24 AM   Modules accepted: Orders

## 2017-06-06 NOTE — Progress Notes (Addendum)
[facility] for Infectious Disease   CC: consideration for treatment for chronic hepatitis C  HPI:  +Megan Kelly is a 56 y.o. female who presents for initial evaluation and management of chronic hepatitis C.  Patient tested positive in [place] about 20 years ago. Hepatitis C-associated risk factors present are: none. Patient denies history of blood transfusion, intranasal drug use, IV drug abuse. Patient has had other studies performed. Results: hepatitis C RNA by PCR, result: positive. Patient has not had prior treatment for Hepatitis C. Patient does not have a past history of liver disease. Patient does not have a family history of liver disease. Patient does not  have associated signs or symptoms related to liver disease.  Labs reviewed and confirm chronic hepatitis C with a positive viral load from her PCP.  No genotype.   Records reviewed from Epic and she has had a CT in the past and does not show any cirrhosis.      Patient does not have documented immunity to Hepatitis A. Patient does not have documented immunity to Hepatitis B.    Review of Systems:  Constitutional: negative for fatigue and malaise Gastrointestinal: negative for diarrhea Integument/breast: negative for rash All other systems reviewed and are negative       Past Medical History:  Diagnosis Date  . Cancer ([place])   . Hepatitis C   . Herpes   . Herpes simplex type 2 infection   . Hypertension   . Menorrhagia     Prior to Admission medications   Medication Sig Start Date End Date Taking? Authorizing Provider  albuterol (PROVENTIL HFA;VENTOLIN HFA) 108 (90 Base) MCG/ACT inhaler Inhale 1-2 puffs into the lungs every 6 (six) hours as needed for wheezing or shortness of breath. 05/26/17  Yes Domenic Moras, PA-C  benzonatate (TESSALON) 100 MG capsule Take 1 capsule (100 mg total) by mouth every 8 (eight) hours. 05/26/17  Yes Domenic Moras, PA-C  busPIRone (BUSPAR) 15 MG tablet Take 15 mg by mouth 2 (two) times daily.    Yes [provider]  cloNIDine (CATAPRES) 0.2 MG tablet Take 0.2 mg by mouth daily.   Yes [provider]  clotrimazole (MYCELEX) 10 MG troche Take 1 tablet (10 mg total) by mouth 5 (five) times daily. 11/13/16  Yes Charlesetta Shanks, MD  ferrous fumarate (HEMOCYTE - 106 MG FE) 325 (106 FE) MG TABS tablet Take 1 tablet by mouth.   Yes [provider]  ferrous sulfate 325 (65 FE) MG tablet Take 325 mg by mouth 2 (two) times daily with a meal.   Yes [provider]  fluticasone (VERAMYST) 27.5 MCG/SPRAY nasal spray Place 2 sprays into the nose daily.   Yes [provider]  gabapentin (NEURONTIN) 300 MG capsule Take 300 mg by mouth 3 (three) times daily.   Yes [provider]  ibuprofen (ADVIL,MOTRIN) 600 MG tablet Take 1 tablet (600 mg total) by mouth every 6 (six) hours as needed. 11/13/16  Yes Charlesetta Shanks, MD  oxybutynin (DITROPAN) 5 MG tablet Take 5 mg by mouth 1 day or 1 dose.   Yes [provider]  pantoprazole (PROTONIX) 40 MG tablet Take 40 mg by mouth daily.   Yes [provider]  PARoxetine (PAXIL) 20 MG tablet Take 20 mg by mouth daily.   Yes [provider]    No Known Allergies  Social History  Substance Use Topics  . Smoking status: Current Every Day Smoker    Packs/day: 0.25  Years: 15.00    Types: Cigarettes  . Smokeless tobacco: Never Used     Comment: smoked for 15 yrs  . Alcohol use No    Family History  Problem Relation Age of Onset  . Diabetes Mother   . Hypertension Mother   . Heart disease Father   no cirrhosis   Objective:  Constitutional: in no apparent distress,  Vitals:   06/06/17 0957  Temp: 56 F (36.7 C)   Eyes: anicteric Cardiovascular: Cor RRR Respiratory: CTA B; normal respiratory effort Gastrointestinal: Bowel sounds are normal, liver is not enlarged, spleen is not enlarged. Soft, nt Musculoskeletal: no pedal edema noted Skin: negatives: no rash; no  porphyria cutanea tarda Lymphatic: no cervical lymphadenopathy   Laboratory Genotype: No results found for: HCVGENOTYPE HCV viral load: No results found for: HCVQUANT Lab Results  Component Value Date   WBC 14.4 (H) 11/13/2016   HGB 13.4 11/13/2016   HCT 39.7 11/13/2016   MCV 88.8 11/13/2016   PLT 252 11/13/2016    Lab Results  Component Value Date   CREATININE 0.71 11/13/2016   BUN 8 11/13/2016   NA 137 11/13/2016   K 3.2 (L) 11/13/2016   CL 105 11/13/2016   CO2 24 11/13/2016    Lab Results  Component Value Date   ALT 30 11/13/2016   AST 31 11/13/2016   ALKPHOS 86 11/13/2016     Labs and history reviewed and show CHILD-PUGH unknown  5-6 points: Child class A 7-9 points: Child class B 10-15 points: Child class C  Lab Results  Component Value Date   BILITOT 0.4 11/13/2016   ALBUMIN 3.9 11/13/2016     Assessment: New Patient with Chronic Hepatitis C genotype unknown, untreated.  I discussed with the patient the lab findings that confirm chronic hepatitis C as well as the natural history and progression of disease including about 30% of people who develop cirrhosis of the liver if left untreated and once cirrhosis is established there is a 2-7% risk per year of liver cancer and liver failure.  I discussed the importance of treatment and benefits in reducing the risk, even if significant liver fibrosis exists.  Hepatitis surface Ag negative, viral load positive from PCP  Plan: 1) Patient counseled extensively on limiting acetaminophen to no more than 2 grams daily, avoidance of alcohol. 2) Transmission discussed with patient including sexual transmission, sharing razors and toothbrush.   3) Will need referral to gastroenterology if concern for cirrhosis 4) Will need referral for substance abuse counseling: No.; Further work up to include urine drug screen  No. 5) Will prescribe appropriate medication based on genotype and coverage - will get [facility] assisstance. Will  proceed with labs today 6) Hepatitis A and B titers 7) Pneumovax vaccine today 9) Further work up to include liver staging with elastography 10) will follow up after starting medication  She is on 40 mg ppi so will use Mavyret.

## 2017-06-06 NOTE — Patient Instructions (Signed)
Date 06/06/17  Dear Ms [name], As discussed in the [facility], your hepatitis C therapy will include highly effective medication(s) for treatment and will vary based on the type of hepatitis C and insurance approval.  Potential medications include:          Harvoni (sofosbuvir 90mg /ledipasvir 400mg ) tablet oral daily          OR     Epclusa (sofosbuvir 400mg /velpatasvir 100mg ) tablet oral daily          OR      Mavyret (glecaprevir 100 mg/pibrentasvir 40 mg): Take 3 tablets oral daily          OR     Zepatier (elbasvir 50 mg/grazoprevir 100 mg) oral daily, +/- ribavirin              Medications are typically for 8 or 12 weeks total ---------------------------------------------------------------- Your HCV Treatment Start Date: You will be notified by our office once the medication is approved and where you can pick it up (or if mailed)   ---------------------------------------------------------------- [place]:   [facility] [place], [place] [address] Phone: [phone] Hours: Monday to Friday 7:30 am to 6:00 pm   Please always contact your pharmacy at least 3-4 business days before you run out of medications to ensure your next month's medication is ready or 1 week prior to running out if you receive it by mail.  Remember, each prescription is for 28 days. ---------------------------------------------------------------- GENERAL NOTES REGARDING YOUR HEPATITIS C MEDICATION:  Some medications have the following interactions:  - Acid reducing agents such as H2 blockers (ie. Pepcid (famotidine), Zantac (ranitidine), Tagamet (cimetidine), Axid (nizatidine) and proton pump inhibitors (ie. Prilosec (omeprazole), Protonix (pantoprazole), Nexium (esomeprazole), or Aciphex (rabeprazole)). Do not take until you have discussed with a health care provider.    -Antacids that contain magnesium and/or aluminum hydroxide (ie. Milk of Magensia, Rolaids,  Gaviscon, Maalox, Mylanta, an dArthritis Pain Formula).  -Calcium carbonate (calcium supplements or antacids such as Tums, Caltrate, Os-Cal).  -St. John's wort or any products that contain St. John's wort like some herbal supplements  Please inform the office prior to starting any of these medications.  - The common side effects associated with Harvoni include:      1. Fatigue      2. Headache      3. Nausea      4. Diarrhea      5. Insomnia  Please note that this only lists the most common side effects and is NOT a comprehensive list of the potential side effects of these medications. For more information, please review the drug information sheets that come with your medication package from the pharmacy.  ---------------------------------------------------------------- GENERAL HELPFUL HINTS ON HCV THERAPY: 1. Stay well-hydrated. 2. Notify the [facility] of any changes in your other over-the-counter/herbal or prescription medications. 3. If you miss a dose of your medication, take the missed dose as soon as you remember. Return to your regular time/dose schedule the next day.  4.  Do not stop taking your medications without first talking with your healthcare provider. 5.  You may take Tylenol (acetaminophen), as long as the dose is less than 2000 mg (OR no more than 4 tablets of the Tylenol Extra Strengths 500mg  tablet) in 24 hours. 6.  You will see our pharmacist-specialist within the first 2 weeks of starting your medication to monitor for any possible side effects. 7.  You will have labs once during treatment, soon  after treatment completion and one final lab 6 months after treatment completion to verify the virus is out of your system.  Scharlene Gloss, [place] for [place], [place]  [phone]

## 2017-06-07 LAB — COMPLETE METABOLIC PANEL WITH GFR
ALT: 27 U/L (ref 6–29)
AST: 26 U/L (ref 10–35)
Albumin: 4 g/dL (ref 3.6–5.1)
Alkaline Phosphatase: 109 U/L (ref 33–130)
BILIRUBIN TOTAL: 0.2 mg/dL (ref 0.2–1.2)
BUN: 8 mg/dL (ref 7–25)
CALCIUM: 9.1 mg/dL (ref 8.6–10.4)
CO2: 20 mmol/L (ref 20–31)
CREATININE: 0.71 mg/dL (ref 0.50–1.05)
Chloride: 107 mmol/L (ref 98–110)
GFR, Est Non African American: >89 mL/min (ref >=60)
Glucose, Bld: 79 mg/dL (ref 65–99)
Potassium: 3.5 mmol/L (ref 3.5–5.3)
Sodium: 139 mmol/L (ref 135–146)
TOTAL PROTEIN: 7 g/dL (ref 6.1–8.1)

## 2017-06-07 LAB — PROTIME-INR
INR: 1
Prothrombin Time: 10.3 s (ref 9.0–11.5)

## 2017-06-07 LAB — HIV ANTIBODY (ROUTINE TESTING W REFLEX): HIV 1&2 Ab, 4th Generation: NONREACTIVE

## 2017-06-07 LAB — HEPATITIS B CORE ANTIBODY, TOTAL: Hep B Core Total Ab: NONREACTIVE

## 2017-06-07 LAB — HEPATITIS B SURFACE ANTIBODY,QUALITATIVE: Hep B S Ab: POSITIVE — AB

## 2017-06-07 LAB — HEPATITIS A ANTIBODY, TOTAL: HEP A TOTAL AB: REACTIVE — AB

## 2017-06-08 NOTE — Telephone Encounter (Signed)
Patient confirming U/S appointment.  Repeated appointment information and NPO status.

## 2017-06-09 LAB — LIVER FIBROSIS, FIBROTEST-ACTITEST
ALT: 25 U/L (ref 6–29)
APOLIPOPROTEIN A1: 164 mg/dL (ref 101–198)
Alpha-2-Macroglobulin: 235 mg/dL (ref 106–279)
Bilirubin: 0.2 mg/dL (ref 0.2–1.2)
Fibrosis Score: 0.13
GGT: 77 U/L — ABNORMAL HIGH (ref 3–70)
HAPTOGLOBIN: 208 mg/dL (ref 43–212)
Necroinflammat ACT Score: 0.09
Reference ID: 1997586

## 2017-06-09 LAB — HEPATITIS C GENOTYPE

## 2017-06-16 ENCOUNTER — Ambulatory Visit (HOSPITAL_COMMUNITY)
Admission: RE | Admit: 2017-06-16 | Discharge: 2017-06-16 | Disposition: A | Discharge status: Home / Self Care | Payer: Medicaid Other | Source: Ambulatory Visit | Attending: Internal Medicine | Admitting: Internal Medicine

## 2017-06-16 DIAGNOSIS — K769 Liver disease, unspecified: Secondary | ICD-10-CM | POA: Insufficient documentation

## 2017-06-16 DIAGNOSIS — B182 Chronic viral hepatitis C: Secondary | ICD-10-CM | POA: Insufficient documentation

## 2017-10-06 ENCOUNTER — Encounter: Payer: Self-pay | Admitting: Pharmacy Technician

## 2017-10-06 ENCOUNTER — Telehealth: Payer: Self-pay | Admitting: Pharmacist Clinician (PhC)/ Clinical Pharmacy Specialist

## 2017-10-06 NOTE — Telephone Encounter (Signed)
Counseling on Datil before she starts. She stated that she has applied for the [facility] financial assistance.

## 2017-10-26 ENCOUNTER — Ambulatory Visit (INDEPENDENT_AMBULATORY_CARE_PROVIDER_SITE_OTHER): Payer: Self-pay | Admitting: Pharmacist Clinician (PhC)/ Clinical Pharmacy Specialist

## 2017-10-26 DIAGNOSIS — B182 Chronic viral hepatitis C: Secondary | ICD-10-CM

## 2017-10-26 NOTE — Progress Notes (Signed)
HPI: Megan Kelly. Megan Kelly is a 56 y.o. female who is here for her pharmacy follow up for hep C.   Lab Results  Component Value Date   HCVGENOTYPE 1a 06/06/2017    Allergies: No Known Allergies  Vitals:    Past Medical History: Past Medical History:  Diagnosis Date  . Cancer ([place])   . Hepatitis C   . Herpes   . Herpes simplex type 2 infection   . Hypertension   . Menorrhagia     Social History: Social History   Socioeconomic History  . Marital status: Single    Spouse name: Not on file  . Number of children: Not on file  . Years of education: Not on file  . Highest education level: Not on file  Social Needs  . Financial resource strain: Not on file  . Food insecurity - worry: Not on file  . Food insecurity - inability: Not on file  . Transportation needs - medical: Not on file  . Transportation needs - non-medical: Not on file  Occupational History  . Not on file  Tobacco Use  . Smoking status: Current Every Day Smoker    Packs/day: 0.25    Years: 15.00    Pack years: 3.75    Types: Cigarettes  . Smokeless tobacco: Never Used  . Tobacco comment: smoked for 15 yrs  Substance and Sexual Activity  . Alcohol use: No  . Drug use: Yes    Types: Marijuana    Comment: occasionally  . Sexual activity: Not on file  Other Topics Concern  . Not on file  Social History Narrative   ** Merged History Encounter **        Labs: Hep B S Ab (no units)  Date Value  06/06/2017 POS (A)    Lab Results  Component Value Date   HCVGENOTYPE 1a 06/06/2017    No flowsheet data found.  AST (U/L)  Date Value  06/06/2017 26  11/13/2016 31  09/12/2013 28   ALT (U/L)  Date Value  06/06/2017 27  06/06/2017 25  11/13/2016 30  09/12/2013 23   INR (no units)  Date Value  06/06/2017 1.0    CrCl: CrCl cannot be calculated (Patient's most recent lab result is older than the maximum 21 days allowed.).  Fibrosis Score: F3 as assessed by ARFI  Child-Pugh Score: Class  A  Previous Treatment Regimen: None  Assessment: Carlisle started on Mavyret through patient assistance on 10/22. She has not missed any doses. She has some mild headaches and stomach cramps. Advised her that she can take some PRN advil for the headaches. The cramping part has been resolved. She stated that she might have sent in the [facility] financial assistance form but she is not sure. Gave her both forms again so she can fill out to send in. She will turn in the [facility] form here. Explained to her that we need that approval in order to do labs. Therefore, we are going to wait to do it at the EOT and SVR12 visit. Then she will f/u with Dr. Linus Salmons for the cure visit.   Recommendations:  Finish out 8 wks of Mavyret Hep C VL at EOT SVR12 then cure visit with Dr. Lorenda Hatchet [place], [place].D., BCPS, AAHIVP Clinical Infectious [place] for Infectious Disease 10/26/2017, 11:34 AM

## 2017-10-26 NOTE — Patient Instructions (Signed)
Fill out the two forms for financial assistance so we can do labs Come back and see [place] in December Come back for the cure labs in March then the cure visit with Dr. Linus Salmons

## 2017-12-06 ENCOUNTER — Ambulatory Visit (INDEPENDENT_AMBULATORY_CARE_PROVIDER_SITE_OTHER): Payer: Self-pay | Admitting: Pharmacist

## 2017-12-06 DIAGNOSIS — B182 Chronic viral hepatitis C: Secondary | ICD-10-CM

## 2017-12-06 NOTE — Progress Notes (Signed)
[facility] for Infectious Disease Pharmacy Visit  HPI: Megan Kelly. Megan Kelly is a 56 y.o. female who presents to the [employer] clinic for Hep C follow-up.  She has genotype 1a, F2/F3, and completed 8 weeks of Mavyret about a week ago.  Patient Active Problem List   Diagnosis Date Noted  . Chronic hepatitis C without hepatic coma ([place]) 06/06/2017  . Pap smear with human papillomavirus (HPV) positive, normal cytology 03/19/2014  . History of breast cancer 03/14/2014  . Abnormal uterine bleeding (AUB) 03/14/2014  . Papilloma of breast 10/11/2012      Medication List        Accurate as of 12/06/17  1:37 PM. Always use your most recent med list.          albuterol 108 (90 Base) MCG/ACT inhaler Commonly known as:  PROVENTIL HFA;VENTOLIN HFA Inhale 1-2 puffs into the lungs every 6 (six) hours as needed for wheezing or shortness of breath.   benzonatate 100 MG capsule Commonly known as:  TESSALON Take 1 capsule (100 mg total) by mouth every 8 (eight) hours.   busPIRone 15 MG tablet Commonly known as:  BUSPAR   cloNIDine 0.2 MG tablet Commonly known as:  CATAPRES   clotrimazole 10 MG troche Commonly known as:  MYCELEX Take 1 tablet (10 mg total) by mouth 5 (five) times daily.   ferrous fumarate 325 (106 Fe) MG Tabs tablet Commonly known as:  HEMOCYTE - 106 mg FE   ferrous sulfate 325 (65 FE) MG tablet   fluticasone 27.5 MCG/SPRAY nasal spray Commonly known as:  VERAMYST   gabapentin 300 MG capsule Commonly known as:  NEURONTIN   Glecaprevir-Pibrentasvir 100-40 MG Tabs Commonly known as:  MAVYRET Take 3 tablets by mouth daily.   ibuprofen 600 MG tablet Commonly known as:  ADVIL,MOTRIN Take 1 tablet (600 mg total) by mouth every 6 (six) hours as needed.   oxybutynin 5 MG tablet Commonly known as:  DITROPAN   pantoprazole 40 MG tablet Commonly known as:  PROTONIX   PARoxetine 20 MG tablet Commonly known as:  PAXIL       Allergies: No Known  Allergies  Past Medical History: Past Medical History:  Diagnosis Date  . Cancer ([place])   . Hepatitis C   . Herpes   . Herpes simplex type 2 infection   . Hypertension   . Menorrhagia     Social History: Social History   Socioeconomic History  . Marital status: Single    Spouse name: Not on file  . Number of children: Not on file  . Years of education: Not on file  . Highest education level: Not on file  Social Needs  . Financial resource strain: Not on file  . Food insecurity - worry: Not on file  . Food insecurity - inability: Not on file  . Transportation needs - medical: Not on file  . Transportation needs - non-medical: Not on file  Occupational History  . Not on file  Tobacco Use  . Smoking status: Current Every Day Smoker    Packs/day: 0.25    Years: 15.00    Pack years: 3.75    Types: Cigarettes  . Smokeless tobacco: Never Used  . Tobacco comment: smoked for 15 yrs  Substance and Sexual Activity  . Alcohol use: No  . Drug use: Yes    Types: Marijuana    Comment: occasionally  . Sexual activity: Not on file  Other Topics Concern  . Not on file  Social History Narrative   ** Merged History Encounter **        Labs: Hep B S Ab (no units)  Date Value  06/06/2017 POS (A)    Lab Results  Component Value Date   HCVGENOTYPE 1a 06/06/2017    No flowsheet data found.  AST (U/L)  Date Value  06/06/2017 26  11/13/2016 31  09/12/2013 28   ALT (U/L)  Date Value  06/06/2017 27  06/06/2017 25  11/13/2016 30  09/12/2013 23   INR (no units)  Date Value  06/06/2017 1.0    Fibrosis Score: F2/F3 as assessed by ARFI   Assessment: Megan Kelly is here today to follow-up for Hep C.  She completed 8 weeks of Mavyret around 1 week ago.  She took all three pills together at the same time with dinner around 6-7pm every night.  She states she missed one day due to forgetting but no other days/doses missed. She also states that she filled out and sent in the  paperwork for [facility] financial assistance.  She is not sure she is approved or not.  She also sent in the [facility] application she says.  I told her we will get labs today and to bring in the bill if she gets one. Minh already scheduled her for Dr. Linus Salmons for her cure visit in March.   Plan: - Completed 8 weeks of Mavyret - HCV RNA today - F/u with Dr. Linus Salmons 03/15/18 at 145pm  Arisbel Maione L. Olivia Pavelko, PharmD, [place], [place] for Infectious Disease 12/06/2017, 1:37 PM

## 2017-12-08 LAB — HEPATITIS C RNA QUANTITATIVE
HCV QUANT LOG: NOT DETECTED {Log_IU}/mL
HCV RNA, PCR, QN: NOT DETECTED [IU]/mL

## 2017-12-31 ENCOUNTER — Emergency Department (HOSPITAL_BASED_OUTPATIENT_CLINIC_OR_DEPARTMENT_OTHER)
Admission: EM | Admit: 2017-12-31 | Discharge: 2017-12-31 | Disposition: A | Discharge status: Home / Self Care | Payer: Self-pay | Attending: Emergency Medicine | Admitting: Emergency Medicine

## 2017-12-31 ENCOUNTER — Emergency Department (HOSPITAL_BASED_OUTPATIENT_CLINIC_OR_DEPARTMENT_OTHER): Payer: Self-pay

## 2017-12-31 ENCOUNTER — Encounter (HOSPITAL_BASED_OUTPATIENT_CLINIC_OR_DEPARTMENT_OTHER): Payer: Self-pay | Admitting: *Deleted

## 2017-12-31 ENCOUNTER — Other Ambulatory Visit: Payer: Self-pay

## 2017-12-31 DIAGNOSIS — Z79899 Other long term (current) drug therapy: Secondary | ICD-10-CM | POA: Insufficient documentation

## 2017-12-31 DIAGNOSIS — I10 Essential (primary) hypertension: Secondary | ICD-10-CM | POA: Insufficient documentation

## 2017-12-31 DIAGNOSIS — J441 Chronic obstructive pulmonary disease with (acute) exacerbation: Secondary | ICD-10-CM | POA: Insufficient documentation

## 2017-12-31 DIAGNOSIS — H66002 Acute suppurative otitis media without spontaneous rupture of ear drum, left ear: Secondary | ICD-10-CM | POA: Insufficient documentation

## 2017-12-31 DIAGNOSIS — F17210 Nicotine dependence, cigarettes, uncomplicated: Secondary | ICD-10-CM | POA: Insufficient documentation

## 2017-12-31 DIAGNOSIS — Z853 Personal history of malignant neoplasm of breast: Secondary | ICD-10-CM | POA: Insufficient documentation

## 2017-12-31 MED ORDER — ACETAMINOPHEN 325 MG PO TABS
650.0000 mg | ORAL_TABLET | Freq: Once | ORAL | Status: AC
  Administered 2017-12-31: 650 mg via ORAL
  Filled 2017-12-31: qty 2

## 2017-12-31 MED ORDER — BENZONATATE 100 MG PO CAPS: 100.0000 mg | ORAL_CAPSULE | Freq: Three times a day (TID) | ORAL | 0 refills | Status: AC

## 2017-12-31 MED ORDER — AMOXICILLIN-POT CLAVULANATE 875-125 MG PO TABS: 1.0000 | ORAL_TABLET | Freq: Two times a day (BID) | ORAL | 0 refills | Status: AC

## 2017-12-31 MED ORDER — PREDNISONE 10 MG (21) PO TBPK: ORAL_TABLET | Freq: Every day | ORAL | 0 refills | Status: DC

## 2017-12-31 MED ORDER — ALBUTEROL SULFATE HFA 108 (90 BASE) MCG/ACT IN AERS
2.0000 | INHALATION_SPRAY | Freq: Once | RESPIRATORY_TRACT | Status: AC
  Administered 2017-12-31: 2 via RESPIRATORY_TRACT
  Filled 2017-12-31: qty 6.7

## 2017-12-31 MED ORDER — METHYLPREDNISOLONE SODIUM SUCC 125 MG IJ SOLR
125.0000 mg | Freq: Once | INTRAMUSCULAR | Status: AC
Start: 2017-12-31 — End: 2017-12-31
  Administered 2017-12-31: 125 mg via INTRAVENOUS
  Filled 2017-12-31: qty 2

## 2017-12-31 MED ORDER — ALBUTEROL SULFATE (2.5 MG/3ML) 0.083% IN NEBU
5.0000 mg | INHALATION_SOLUTION | Freq: Once | RESPIRATORY_TRACT | Status: AC
  Administered 2017-12-31: 5 mg via RESPIRATORY_TRACT
  Filled 2017-12-31: qty 6

## 2017-12-31 MED ORDER — IPRATROPIUM BROMIDE 0.02 % IN SOLN
0.5000 mg | Freq: Once | RESPIRATORY_TRACT | Status: AC
  Administered 2017-12-31: 0.5 mg via RESPIRATORY_TRACT
  Filled 2017-12-31: qty 2.5

## 2017-12-31 NOTE — Discharge Instructions (Signed)
You can use 2 puffs of your albuterol inhaler with the spacer every 4 hours as needed for shortness of breath. To take your prednisone: take 6 tabs by mouth daily  for 2 days, then 5 tabs for 2 days, then 4 tabs for 2 days, then 3 tabs for 2 days, 2 tabs for 2 days, then 1 tab by mouth daily for 2 days.  Take 1 tablet of benzonatate every 8 hours as needed for cough.  For your left ear infection, take 1 tablet of Augmentin every 12 hours for the next 10 days.  Please do not stop taking this medication even if your symptoms start to improve.  Take 650 mg of Tylenol once every 6 hours for fever or body aches.  Please make sure to continue to drink plenty of fluids to avoid getting dehydrated until your symptoms improve.   If you develop new or worsening symptoms, including difficulty breathing, respiratory distress, a fever that does not improve with Tylenol, chest pain with exertion, or if you develop vomiting and diarrhea and are unable to keep down any fluids, please return to the emergency department for reevaluation.

## 2017-12-31 NOTE — ED Notes (Signed)
Patients sat's stayed between 95% and 97% and heart rate between 117-120.

## 2017-12-31 NOTE — ED Provider Notes (Signed)
[place] EMERGENCY DEPARTMENT Provider Note   CSN: [id number] Arrival date & time: 12/31/17  2016     History   Chief Complaint Chief Complaint  Patient presents with  . Cough    HPI Megan Kelly is a 57 y.o. female with a h/o of HSV Type II, HTN, Hepatitis C s/p tx with Mavyret who presents to the Emergency Department with a chief complaint of productive cough since yesterday. She reports associated left otalgia that radiates down her left jaw, chest tightness, dyspnea that is worse with exertion, and post-tussive emesis x4. She denies fever, but temperature noted to be 100.4 on arrival to the ED. She denies chest pain, nausea, diarrhea, sore throat, myalgias, dental pain, or right otalgia. She treated her symptoms with her albuterol inhaler prior to arrival, but ran out of the medications.  She reports her intermittent cough is aggravated with exertion and improved by nothing.  No known sick contacts. She is a current, every day smoker, but reports that she should just quit since she hasn't had a cigarette in the the last 3 days. She is UTD on her influenza vaccine.   The history is provided by the patient. No language interpreter was used.    Past Medical History:  Diagnosis Date  . Cancer [facility])    states it was benign  . Hepatitis C   . Herpes   . Herpes simplex type 2 infection   . Hypertension   . Menorrhagia     Patient Active Problem List   Diagnosis Date Noted  . Chronic hepatitis C without hepatic coma ([place]) 06/06/2017  . Pap smear with human papillomavirus (HPV) positive, normal cytology 03/19/2014  . History of breast cancer 03/14/2014  . Abnormal uterine bleeding (AUB) 03/14/2014  . Papilloma of breast 10/11/2012    Past Surgical History:  Procedure Laterality Date  . BREAST LUMPECTOMY WITH NEEDLE LOCALIZATION  11/08/2012   Procedure: BREAST LUMPECTOMY WITH NEEDLE LOCALIZATION;  Surgeon: Harl Bowie, MD;  Location: [place];  Service:  General;  Laterality: Right;  . BREAST SURGERY    . NO PAST SURGERIES      OB History    Gravida Para Term Preterm AB Living   2 1 1  0 1 1   SAB TAB Ectopic Multiple Live Births   0 1             Home Medications    Prior to Admission medications   Medication Sig Start Date End Date Taking? Authorizing Provider  albuterol (PROVENTIL HFA;VENTOLIN HFA) 108 (90 Base) MCG/ACT inhaler Inhale 1-2 puffs into the lungs every 6 (six) hours as needed for wheezing or shortness of breath. 05/26/17  Yes Domenic Moras, PA-C  busPIRone (BUSPAR) 15 MG tablet Take 15 mg by mouth 2 (two) times daily.   Yes [provider]  cloNIDine (CATAPRES) 0.2 MG tablet Take 0.2 mg by mouth daily.   Yes [provider]  ferrous fumarate (HEMOCYTE - 106 MG FE) 325 (106 FE) MG TABS tablet Take 1 tablet by mouth.   Yes [provider]  fluticasone (VERAMYST) 27.5 MCG/SPRAY nasal spray Place 2 sprays into the nose daily.   Yes [provider]  gabapentin (NEURONTIN) 300 MG capsule Take 300 mg by mouth 3 (three) times daily.   Yes [provider]  oxybutynin (DITROPAN) 5 MG tablet Take 5 mg by mouth 1 day or 1 dose.   Yes [provider]  pantoprazole (PROTONIX) 40 MG tablet  Take 40 mg by mouth daily.   Yes [provider]  amoxicillin-clavulanate (AUGMENTIN) 875-125 MG tablet Take 1 tablet by mouth every 12 (twelve) hours for 10 days. 12/31/17 01/10/18  Camary Sosa A, PA-C  benzonatate (TESSALON) 100 MG capsule Take 1 capsule (100 mg total) by mouth every 8 (eight) hours. 12/31/17   Jazsmin Couse A, PA-C  ferrous sulfate 325 (65 FE) MG tablet Take 325 mg by mouth 2 (two) times daily with a meal.    [provider]  predniSONE (STERAPRED UNI-PAK 21 TAB) 10 MG (21) TBPK tablet Take by mouth daily. Take 6 tabs by mouth daily  for 2 days, then 5 tabs for 2 days, then 4 tabs for 2 days, then 3 tabs for 2 days, 2 tabs for 2 days, then 1 tab by mouth daily for 2  days 12/31/17   Joanne Gavel, PA-C    Family History Family History  Problem Relation Age of Onset  . Diabetes Mother   . Hypertension Mother   . Heart disease Father     Social History Social History   Tobacco Use  . Smoking status: Current Every Day Smoker    Packs/day: 0.25    Years: 15.00    Pack years: 3.75    Types: Cigarettes  . Smokeless tobacco: Never Used  . Tobacco comment: smoked for 15 yrs  Substance Use Topics  . Alcohol use: No  . Drug use: Yes    Types: Marijuana    Comment: occasionally     Allergies   Patient has no known allergies.   Review of Systems Review of Systems  Constitutional: Negative for appetite change, chills, fatigue and fever.  HENT: Positive for ear pain. Negative for congestion, ear discharge, mouth sores, postnasal drip, rhinorrhea, sinus pressure and sore throat.   Eyes: Negative for visual disturbance.  Respiratory: Positive for cough, chest tightness and shortness of breath. Negative for wheezing and stridor.   Cardiovascular: Negative for chest pain, palpitations and leg swelling.  Gastrointestinal: Negative for abdominal pain, diarrhea, nausea and vomiting.  Genitourinary: Negative for dysuria, frequency, hematuria and urgency.  Musculoskeletal: Negative for arthralgias, back pain, myalgias and neck stiffness.  Skin: Negative for rash.  Neurological: Negative for syncope, light-headedness, numbness and headaches.  Hematological: Negative for adenopathy.  Psychiatric/Behavioral: The patient is not nervous/anxious.   All other systems reviewed and are negative.    Physical Exam Updated Vital Signs BP 134/77 (BP Location: Left Arm)   Pulse (!) 102   Temp 99 F (37.2 C) (Oral)   Resp 20   SpO2 97%   Physical Exam  Constitutional: No distress.  HENT:  Head: Normocephalic and atraumatic.  Right Ear: Hearing, tympanic membrane, external ear and ear canal normal.  Left Ear: Hearing, external ear and ear canal  normal.  Nose: Nose normal. Right sinus exhibits no maxillary sinus tenderness and no frontal sinus tenderness. Left sinus exhibits no maxillary sinus tenderness and no frontal sinus tenderness.  Mouth/Throat: Uvula is midline, oropharynx is clear and moist and mucous membranes are normal. No tonsillar exudate.  Left TM bulging and dull with displaced cone of light. No mastoid tenderness bilaterally.   Eyes: Conjunctivae are normal.  Neck: Normal range of motion. Neck supple. No JVD present.  Cardiovascular: Normal rate, regular rhythm, normal heart sounds and intact distal pulses. Exam reveals no gallop and no friction rub.  No murmur heard. Pulmonary/Chest: Effort normal. No respiratory distress.  Decreased air movement bilaterally; lungs are otherwise  CTAB. No accessory muscle use or retractions.   Abdominal: Soft. She exhibits no distension and no mass. There is no tenderness. There is no rebound and no guarding. No hernia.  Lymphadenopathy:    She has no cervical adenopathy.  Neurological: She is alert.  Skin: Skin is warm. Capillary refill takes less than 2 seconds. No rash noted. She is not diaphoretic.  Psychiatric: Her behavior is normal.  Nursing note and vitals reviewed.    ED Treatments / Results  Labs (all labs ordered are listed, but only abnormal results are displayed) Labs Reviewed - No data to display  EKG  EKG Interpretation None       Radiology Dg Chest 2 View  Result Date: 12/31/2017 CLINICAL DATA:  57 year old female with productive cough. EXAM: CHEST  2 VIEW COMPARISON:  Chest radiograph dated 05/26/2017 FINDINGS: There is mild emphysematous changes of the lungs. No focal consolidation, pleural effusion, or pneumothorax. The cardiac silhouette is within normal limits. No acute osseous pathology. IMPRESSION: No active cardiopulmonary disease. Electronically Signed   By: Anner Crete M.D.   On: 12/31/2017 21:31    Procedures Procedures (including  critical care time)  Medications Ordered in ED Medications  acetaminophen (TYLENOL) tablet 650 mg (650 mg Oral Given 12/31/17 2137)  ipratropium (ATROVENT) nebulizer solution 0.5 mg (0.5 mg Nebulization Given 12/31/17 2216)  albuterol (PROVENTIL) (2.5 MG/3ML) 0.083% nebulizer solution 5 mg (5 mg Nebulization Given 12/31/17 2215)  methylPREDNISolone sodium succinate (SOLU-MEDROL) 125 mg/2 mL injection 125 mg (125 mg Intravenous Given 12/31/17 2315)  albuterol (PROVENTIL HFA;VENTOLIN HFA) 108 (90 Base) MCG/ACT inhaler 2 puff (2 puffs Inhalation Given 12/31/17 2337)     Initial Impression / Assessment and Plan / ED Course  I have reviewed the triage vital signs and the nursing notes.  Pertinent labs & imaging results that were available during my care of the patient were reviewed by me and considered in my medical decision making (see chart for details).     57 year old female presenting with h/o of HSV Type II, HTN, Hepatitis C s/p tx with Mavyret. She presents with left otalgia, dyspnea, productive cough, post-tussive emesis, and chest tightness.   On PE, left TM is dull with displaced cone of light and bulging, consistent with acute otitis media. No concern for acute mastoiditis, meningitis. Patient discharged home with Augmentin.    CXR with mild emphysematous changes. On PE, she has decreased air movement bilaterally, improving after Duoneb, which also resolved dyspnea and chest tightness. SaO2 95-97% with ambulation after Duoneb. She has mild tachycardia, likely secondary to albuterol. Febrile to 100.4, improving to 99 with Tylenol. Solumedrol also given in the ED. She is out of her home albuterol, which I have replaced in the ED. Doubt influenza, PE, acute CHF exacerbation.   Will d/c the patient to home with strict return precautions. NAD. She is hemodynamically stable and safe for d/c at this time.       Final Clinical Impressions(s) / ED Diagnoses   Final diagnoses:  Chronic  obstructive pulmonary disease with acute exacerbation (HCC)  Acute suppurative otitis media of left ear without spontaneous rupture of tympanic membrane, recurrence not specified    ED Discharge Orders        Ordered    predniSONE (STERAPRED UNI-PAK 21 TAB) 10 MG (21) TBPK tablet  Daily     12/31/17 2335    amoxicillin-clavulanate (AUGMENTIN) 875-125 MG tablet  Every 12 hours     12/31/17 2335    benzonatate (TESSALON)  100 MG capsule  Every 8 hours     12/31/17 2335       Joline Maxcy A, PA-C 01/01/18 5176    Margette Fast, MD 01/01/18 1011

## 2017-12-31 NOTE — ED Notes (Signed)
Pt ambulating in hallway. No distress.

## 2017-12-31 NOTE — ED Notes (Signed)
John, RRT in with pt.

## 2017-12-31 NOTE — ED Notes (Signed)
Patient given albuterol mdi with spacer.  Patient with good understanding of use and return demonstration of proper technique.

## 2017-12-31 NOTE — ED Triage Notes (Signed)
Pt reports cough since yesterday, left ear pain, teeth "feel funny", chest hurts with cough and vomited x 4 today after coughing episode

## 2017-12-31 NOTE — ED Notes (Signed)
Pt given d/c instructions as per chart. Rx x 3. Verbalizes understanding. No questions. 

## 2018-03-08 ENCOUNTER — Other Ambulatory Visit: Payer: Self-pay

## 2018-03-15 ENCOUNTER — Ambulatory Visit (INDEPENDENT_AMBULATORY_CARE_PROVIDER_SITE_OTHER): Payer: Self-pay | Admitting: Internal Medicine

## 2018-03-15 ENCOUNTER — Other Ambulatory Visit: Payer: Self-pay

## 2018-03-15 ENCOUNTER — Encounter: Payer: Self-pay | Admitting: Internal Medicine

## 2018-03-15 VITALS — BP 130/85 | HR 57 | Temp 98.3°F | Ht 58.75 in | Wt 125.0 lb

## 2018-03-15 DIAGNOSIS — K760 Fatty (change of) liver, not elsewhere classified: Secondary | ICD-10-CM

## 2018-03-15 DIAGNOSIS — K7400 Hepatic fibrosis, unspecified: Secondary | ICD-10-CM | POA: Insufficient documentation

## 2018-03-15 DIAGNOSIS — B182 Chronic viral hepatitis C: Secondary | ICD-10-CM

## 2018-03-15 NOTE — Assessment & Plan Note (Signed)
Discussed with her. Encouraged weight loss.  May improve with successful treatment for hepatitis C.  Further.  May need follow up in the future per her PCP.

## 2018-03-15 NOTE — Assessment & Plan Note (Addendum)
SVR 12 today to confirm cure; will be notified and no follow up if negative.

## 2018-03-15 NOTE — Assessment & Plan Note (Signed)
Discussed results.  No HCC screening indicated.

## 2018-03-15 NOTE — Progress Notes (Signed)
   Subjective:    Patient ID: Megan Kelly. Megan Kelly, female    DOB: June 11, 1961, 57 y.o.   MRN: [id number]  HPI Here for follow up of chronic hepatitis C.  Had genotype 1a, treated with 8 weeks of Mavyret in December and no issues since.  F2/3 on elastography and F0 on Fibrosure.  No associated fatigue or headaches during treatment.  Some fatty liver on ultrasound.     Review of Systems  Gastrointestinal: Negative for diarrhea and nausea.  Skin: Negative for rash.       Objective:   Physical Exam  Constitutional: She appears well-developed and well-nourished. No distress.  HENT:  Mouth/Throat: No oropharyngeal exudate.  Eyes: No scleral icterus.  Cardiovascular: Normal rate, regular rhythm and normal heart sounds.  No murmur heard. Pulmonary/Chest: Effort normal and breath sounds normal. No respiratory distress.  Skin: No rash noted.   SH: no alcohol       Assessment & Plan:

## 2018-03-17 ENCOUNTER — Telehealth: Payer: Self-pay | Admitting: *Deleted

## 2018-03-17 LAB — HEPATITIS C RNA QUANTITATIVE
HCV QUANT LOG: NOT DETECTED {Log_IU}/mL
HCV RNA, PCR, QN: <15 IU/mL

## 2018-03-17 NOTE — Telephone Encounter (Signed)
-----   Message from Thayer Headings, MD sent at 03/17/2018  9:53 AM EDT ----- Please let her know that her final HCV viral load remains negative so she is considered cured.  No follow up needed. thanks

## 2018-03-17 NOTE — Telephone Encounter (Signed)
Left voicemail with information. Landis Gandy, RN

## 2021-03-09 ENCOUNTER — Emergency Department (HOSPITAL_BASED_OUTPATIENT_CLINIC_OR_DEPARTMENT_OTHER): Payer: Medicare HMO

## 2021-03-09 ENCOUNTER — Emergency Department (HOSPITAL_BASED_OUTPATIENT_CLINIC_OR_DEPARTMENT_OTHER)
Admission: EM | Admit: 2021-03-09 | Discharge: 2021-03-09 | Disposition: A | Discharge status: Home / Self Care | Payer: Medicare HMO | Attending: Emergency Medicine | Admitting: Emergency Medicine

## 2021-03-09 ENCOUNTER — Encounter (HOSPITAL_BASED_OUTPATIENT_CLINIC_OR_DEPARTMENT_OTHER): Payer: Self-pay | Admitting: Emergency Medicine

## 2021-03-09 ENCOUNTER — Other Ambulatory Visit: Payer: Self-pay

## 2021-03-09 DIAGNOSIS — S2241XA Multiple fractures of ribs, right side, initial encounter for closed fracture: Secondary | ICD-10-CM | POA: Diagnosis not present

## 2021-03-09 DIAGNOSIS — W109XXA Fall (on) (from) unspecified stairs and steps, initial encounter: Secondary | ICD-10-CM | POA: Diagnosis not present

## 2021-03-09 DIAGNOSIS — Z853 Personal history of malignant neoplasm of breast: Secondary | ICD-10-CM | POA: Diagnosis not present

## 2021-03-09 DIAGNOSIS — Y9301 Activity, walking, marching and hiking: Secondary | ICD-10-CM | POA: Insufficient documentation

## 2021-03-09 DIAGNOSIS — W108XXA Fall (on) (from) other stairs and steps, initial encounter: Secondary | ICD-10-CM

## 2021-03-09 DIAGNOSIS — S299XXA Unspecified injury of thorax, initial encounter: Secondary | ICD-10-CM | POA: Diagnosis present

## 2021-03-09 DIAGNOSIS — M546 Pain in thoracic spine: Secondary | ICD-10-CM | POA: Diagnosis not present

## 2021-03-09 DIAGNOSIS — F17210 Nicotine dependence, cigarettes, uncomplicated: Secondary | ICD-10-CM | POA: Insufficient documentation

## 2021-03-09 DIAGNOSIS — S0011XA Contusion of right eyelid and periocular area, initial encounter: Secondary | ICD-10-CM | POA: Insufficient documentation

## 2021-03-09 DIAGNOSIS — Z79899 Other long term (current) drug therapy: Secondary | ICD-10-CM | POA: Insufficient documentation

## 2021-03-09 DIAGNOSIS — W19XXXA Unspecified fall, initial encounter: Secondary | ICD-10-CM

## 2021-03-09 DIAGNOSIS — I10 Essential (primary) hypertension: Secondary | ICD-10-CM | POA: Diagnosis not present

## 2021-03-09 MED ORDER — HYDROCODONE-ACETAMINOPHEN 5-325 MG PO TABS
1.0000 | ORAL_TABLET | Freq: Once | ORAL | Status: AC
  Administered 2021-03-09: 1 via ORAL
  Filled 2021-03-09: qty 1

## 2021-03-09 MED ORDER — OXYCODONE HCL 5 MG PO CAPS: 5.0000 mg | ORAL_CAPSULE | ORAL | 0 refills | Status: AC | PRN

## 2021-03-09 NOTE — ED Triage Notes (Signed)
Patient arrived via POV c/o fall with back pain. Patient states she was walking up steps, fell backward landing on back/right side, down 4 or 5 steps. Patient states back and "entire right side" hurt. Patient has hematoma to area above right eye. Patient denies LOC. Patient is AO x 4, VS WDL, rolled in via wheelchair.

## 2021-03-09 NOTE — Discharge Instructions (Addendum)
Follow-up with your doctor for recheck. You can take Tylenol as needed as directed for pain.  Take oxycodone for pain not controlled with the Tylenol.  This can make you unsteady, be sure to only take if you have someone to help you with walking or climbing stairs to prevent further falls. Use incentive spirometer as directed.

## 2021-03-09 NOTE — ED Notes (Signed)
ED Provider at bedside. 

## 2021-03-09 NOTE — ED Provider Notes (Signed)
[place] EMERGENCY DEPARTMENT Provider Note   CSN: [id number] Arrival date & time: 03/09/21  2014     History Chief Complaint  Patient presents with  . Fall  . Back Pain    Megan Kelly is a 60 y.o. female.  60 year old female presents with complaint of injuries from a fall.  Patient states that she was going up 3 wooden steps today when she lost her footing and fell backwards landing on the cement landing.  Patient reports pain in her upper back as well as right shoulder area.  Back pain radiates down her right side.  States that she did hit her head but did not lose consciousness, is not anticoagulated, no vomiting or visual disturbance.  Fall occurred 6 hours prior to evaluation.        Past Medical History:  Diagnosis Date  . Cancer [facility])    states it was benign  . Hepatitis C   . Herpes   . Herpes simplex type 2 infection   . Hypertension   . Menorrhagia     Patient Active Problem List   Diagnosis Date Noted  . Liver fibrosis 03/15/2018  . Fatty liver 03/15/2018  . Chronic hepatitis C without hepatic coma ([place]) 06/06/2017  . Pap smear with human papillomavirus (HPV) positive, normal cytology 03/19/2014  . History of breast cancer 03/14/2014  . Abnormal uterine bleeding (AUB) 03/14/2014  . Papilloma of breast 10/11/2012    Past Surgical History:  Procedure Laterality Date  . BREAST LUMPECTOMY WITH NEEDLE LOCALIZATION  11/08/2012   Procedure: BREAST LUMPECTOMY WITH NEEDLE LOCALIZATION;  Surgeon: Harl Bowie, MD;  Location: [place];  Service: General;  Laterality: Right;  . BREAST SURGERY    . NO PAST SURGERIES       OB History    Gravida  2   Para  1   Term  1   Preterm  0   AB  1   Living  1     SAB  0   IAB  1   Ectopic      Multiple      Live Births              Family History  Problem Relation Age of Onset  . Diabetes Mother   . Hypertension Mother   . Heart disease Father     Social History    Tobacco Use  . Smoking status: Current Every Day Smoker    Packs/day: 0.25    Years: 15.00    Pack years: 3.75    Types: Cigarettes  . Smokeless tobacco: Never Used  . Tobacco comment: smoked for 15 yrs  Vaping Use  . Vaping Use: Never used  Substance Use Topics  . Alcohol use: No  . Drug use: Yes    Types: Marijuana    Comment: occasionally    Home Medications Prior to Admission medications   Medication Sig Start Date End Date Taking? Authorizing Provider  oxycodone (OXY-IR) 5 MG capsule Take 1 capsule (5 mg total) by mouth every 4 (four) hours as needed. 03/09/21  Yes Tacy Learn, PA-C  albuterol (PROVENTIL HFA;VENTOLIN HFA) 108 (90 Base) MCG/ACT inhaler Inhale 1-2 puffs into the lungs every 6 (six) hours as needed for wheezing or shortness of breath. 05/26/17   Domenic Moras, PA-C  benzonatate (TESSALON) 100 MG capsule Take 1 capsule (100 mg total) by mouth every 8 (eight) hours. 12/31/17   McDonald, Mia A, PA-C  busPIRone (BUSPAR)  15 MG tablet Take 15 mg by mouth 2 (two) times daily.    [provider]  cloNIDine (CATAPRES) 0.2 MG tablet Take 0.2 mg by mouth daily.    [provider]  ferrous fumarate (HEMOCYTE - 106 MG FE) 325 (106 FE) MG TABS tablet Take 1 tablet by mouth.    [provider]  fluticasone (VERAMYST) 27.5 MCG/SPRAY nasal spray Place 2 sprays into the nose daily.    [provider]  gabapentin (NEURONTIN) 300 MG capsule Take 300 mg by mouth 3 (three) times daily.    [provider]  oxybutynin (DITROPAN) 5 MG tablet Take 5 mg by mouth 1 day or 1 dose.    [provider]  PROZAC 40 MG capsule Take 40 mg by mouth every morning. 03/07/18   [provider]  traZODone (DESYREL) 50 MG tablet TAKE 1 & 1/2 Tablets BY MOUTH EVERY EVENING 2 HOURS BEFORE BED 03/07/18   [provider]    Allergies    Patient has no known allergies.  Review of Systems   Review of Systems  Constitutional: Negative for  fever.  Eyes: Negative for visual disturbance.  Gastrointestinal: Negative for nausea and vomiting.  Musculoskeletal: Positive for arthralgias and back pain. Negative for neck pain.  Skin: Negative for rash and wound.  Allergic/Immunologic: Negative for immunocompromised state.  Neurological: Negative for dizziness, weakness and headaches.  Hematological: Does not bruise/bleed easily.  Psychiatric/Behavioral: Negative for confusion.  All other systems reviewed and are negative.   Physical Exam Updated Vital Signs BP 120/77 (BP Location: Right Arm)   Pulse (!) 58   Temp 98 F (36.7 C) (Oral)   Resp 20   Ht 4\' 10"  (1.473 m)   Wt 59.9 kg   SpO2 93%   BMI 27.59 kg/m   Physical Exam Vitals and nursing note reviewed.  Constitutional:      General: She is not in acute distress.    Appearance: She is well-developed. She is not diaphoretic.  HENT:     Head: Normocephalic and atraumatic.      Nose: Nose normal.     Mouth/Throat:     Mouth: Mucous membranes are moist.  Eyes:     Extraocular Movements: Extraocular movements intact.     Pupils: Pupils are equal, round, and reactive to light.  Cardiovascular:     Pulses: Normal pulses.  Pulmonary:     Effort: Pulmonary effort is normal.  Abdominal:     Palpations: Abdomen is soft.     Tenderness: There is no abdominal tenderness.  Musculoskeletal:        General: Tenderness present. No swelling or deformity.     Cervical back: Normal range of motion and neck supple. No tenderness or bony tenderness. Normal range of motion.     Thoracic back: Tenderness and bony tenderness present.     Lumbar back: Tenderness present. No bony tenderness.       Back:     Right lower leg: No edema.     Left lower leg: No edema.     Comments: Tenderness with palpation range of motion of the right shoulder.  No pain with range of motion palpation of the wrists, no pain with logroll of hips or range of motion lower extremities.  Skin:    General:  Skin is warm and dry.     Findings: No erythema or rash.  Neurological:     Mental Status: She is alert and oriented to person, place,  and time.     Cranial Nerves: No cranial nerve deficit.     Sensory: No sensory deficit.     Motor: No weakness.  Psychiatric:        Behavior: Behavior normal.     ED Results / Procedures / Treatments   Labs (all labs ordered are listed, but only abnormal results are displayed) Labs Reviewed - No data to display  EKG None  Radiology DG Thoracic Spine W/Swimmers  Result Date: 03/09/2021 CLINICAL DATA:  Fall, landed on back and right side.  Pain EXAM: THORACIC SPINE - 3 VIEWS COMPARISON:  None. FINDINGS: Normal alignment. No thoracic fracture or focal bone lesion. Early degenerative spurring in the lower thoracic spine. Right lateral 6th and 7th rib fractures. Right base atelectasis. No visible effusion or pneumothorax. IMPRESSION: No acute bony abnormality. Lateral right 6th and 7th rib fractures. Right base atelectasis. No visible pneumothorax. Electronically Signed   By: Rolm Baptise M.D.   On: 03/09/2021 22:31   DG Shoulder Right  Result Date: 03/09/2021 CLINICAL DATA:  Fall on right side, pain. EXAM: RIGHT SHOULDER - 2+ VIEW COMPARISON:  None. FINDINGS: AC and glenohumeral joints are intact. No fracture, subluxation or dislocation in the right shoulder. Posterolateral fractures noted in the right 5th and 6th ribs. Seventh rib fracture seen on thoracic spine series not visualized on this study. IMPRESSION: Right 5th and 6th rib fractures. No acute bony abnormality in the right shoulder. Electronically Signed   By: Rolm Baptise M.D.   On: 03/09/2021 22:32    Procedures Procedures   Medications Ordered in ED Medications  HYDROcodone-acetaminophen (NORCO/VICODIN) 5-325 MG per tablet 1 tablet (1 tablet Oral Given 03/09/21 2215)    ED Course  I have reviewed the triage vital signs and the nursing notes.  Pertinent labs & imaging results that were  available during my care of the patient were reviewed by me and considered in my medical decision making (see chart for details).  Clinical Course as of 03/09/21 2250  Mon Mar 09, 1969  [id number] 60 year old female with complaint of right back pain after falling down 3 steps and landing on her back today.  On exam, has a small contusion to her right lateral eyebrow without crepitus, extraocular movements intact. Has tenderness to her mid T-spine as well as right T and L-spine areas. Patient was given hydrocodone for her pain. [LM]  2248 X-rays show fractures of the right fifth and sixth ribs, no pneumothorax.  Discussed results with patient, she will be given incentive spirometer and pain medication, advised to recheck with her PCP.  Patient verbalizes understanding of discharge instructions and plan. [LM]    Clinical Course User Index [LM] Roque Lias   MDM Rules/Calculators/A&P                          Final Clinical Impression(s) / ED Diagnoses Final diagnoses:  Fall down stairs, initial encounter  Closed fracture of multiple ribs of right side, initial encounter    Rx / DC Orders ED Discharge Orders         Ordered    oxycodone (OXY-IR) 5 MG capsule  Every 4 hours PRN        03/09/21 2246           Tacy Learn, PA-C 03/09/21 2250    Truddie Hidden, MD 03/09/21 2308
